# Patient Record
Sex: Female | Born: 1965 | Race: Black or African American | Hispanic: No | Marital: Married | Smoking: Never smoker
Health system: Southern US, Community
[De-identification: ages and names within clinical notes are randomized; demographics above are authoritative.]

## PROBLEM LIST (undated history)

## (undated) ENCOUNTER — Ambulatory Visit: Admission: EM | Payer: 59 | Source: Home / Self Care

## (undated) DIAGNOSIS — F32A Depression, unspecified: Secondary | ICD-10-CM

## (undated) DIAGNOSIS — G43909 Migraine, unspecified, not intractable, without status migrainosus: Secondary | ICD-10-CM

## (undated) DIAGNOSIS — J45909 Unspecified asthma, uncomplicated: Secondary | ICD-10-CM

## (undated) DIAGNOSIS — F329 Major depressive disorder, single episode, unspecified: Secondary | ICD-10-CM

## (undated) HISTORY — PX: TUBAL LIGATION: SHX77

---

## 2005-02-26 ENCOUNTER — Emergency Department: Payer: Self-pay | Admitting: Emergency Medicine

## 2009-11-28 ENCOUNTER — Ambulatory Visit: Payer: Self-pay | Admitting: Family Medicine

## 2012-04-02 ENCOUNTER — Ambulatory Visit: Payer: Self-pay | Admitting: Family Medicine

## 2012-04-17 ENCOUNTER — Ambulatory Visit: Payer: Self-pay

## 2012-04-17 LAB — RAPID STREP-A WITH REFLX: Micro Text Report: NEGATIVE

## 2012-04-21 LAB — BETA STREP CULTURE(ARMC)

## 2014-02-18 ENCOUNTER — Ambulatory Visit: Payer: Self-pay

## 2014-05-25 ENCOUNTER — Ambulatory Visit: Payer: Self-pay | Admitting: Emergency Medicine

## 2014-07-21 ENCOUNTER — Ambulatory Visit
Admit: 2014-07-21 | Disposition: A | Discharge status: Home / Self Care | Payer: Self-pay | Attending: Family Medicine | Admitting: Family Medicine

## 2015-02-28 ENCOUNTER — Ambulatory Visit
Admission: EM | Admit: 2015-02-28 | Discharge: 2015-02-28 | Disposition: A | Discharge status: Home / Self Care | Payer: BLUE CROSS/BLUE SHIELD | Attending: Family Medicine | Admitting: Family Medicine

## 2015-02-28 ENCOUNTER — Ambulatory Visit (INDEPENDENT_AMBULATORY_CARE_PROVIDER_SITE_OTHER): Payer: BLUE CROSS/BLUE SHIELD

## 2015-02-28 ENCOUNTER — Encounter: Payer: Self-pay | Admitting: Emergency Medicine

## 2015-02-28 DIAGNOSIS — J0100 Acute maxillary sinusitis, unspecified: Secondary | ICD-10-CM | POA: Insufficient documentation

## 2015-02-28 DIAGNOSIS — J40 Bronchitis, not specified as acute or chronic: Secondary | ICD-10-CM

## 2015-02-28 DIAGNOSIS — M940 Chondrocostal junction syndrome [Tietze]: Secondary | ICD-10-CM

## 2015-02-28 DIAGNOSIS — R05 Cough: Secondary | ICD-10-CM | POA: Diagnosis present

## 2015-02-28 MED ORDER — PREDNISONE 20 MG PO TABS: 40.0000 mg | ORAL_TABLET | Freq: Every day | ORAL | Status: AC

## 2015-02-28 MED ORDER — AMOXICILLIN-POT CLAVULANATE 875-125 MG PO TABS: 1.0000 | ORAL_TABLET | Freq: Two times a day (BID) | ORAL | Status: DC

## 2015-02-28 MED ORDER — GUAIFENESIN-CODEINE 100-10 MG/5ML PO SOLN: 5.0000 mL | Freq: Three times a day (TID) | ORAL | Status: DC | PRN

## 2015-02-28 NOTE — ED Provider Notes (Signed)
[facility]  ____________________________________________  Time seen: Approximately 9:35 AM  I have reviewed the triage vital signs and the nursing notes.   HISTORY  Chief Complaint Cough and Facial Pain   HPI Grace Martinez is a 49 y.o. female presents for complaints of 5 days of runny nose, congestion, sinus drainage, sinus pressure, and cough. States has history of asthma and reports occasionally has wheezing and reports that the wheezing resolves with home albuterol inhaler. Denies shortness of breath or current wheezing. Patient reports that she has been coughing, sneezing and blowing her nose a lot. Patient present she feels like she has congestion in her chest. Patient states that since Friday when she coughs, sneezes or presses on her chest she has some tenderness to touch. Patient states that she has no chest tenderness or chest pain unless actively touching the area on her chest or moving such as when sneezing. Denies chest pain with deep breaths. Denies current chest pain. Patient reports that chest tenderness is fully reproducible by touch. Again states no chest pain unless actively touching the area or moving.  Reports that she feels she had been getting thick green nasal drainage initially but states now sinus pressure has increased and not getting as much drainage out. States current sinus pressure to her face along her cheek areas is 5 out of 10 aching. States that she does feel some drainage in the back of her throat. States biggest complaint is a sinus discomfort.  Reports continues to eat and drink well. Denies fevers. Denies current chest pain. Denies shortness of breath, abdominal pain, neck or back pain, extremity pain, calf pain, leg swelling, recent trips, recent surgeries or recent immobilization.   History reviewed. No pertinent past medical history.  There are no active problems to display for this patient.   Past Surgical History  Procedure  Laterality Date  . Tubal ligation      Current Outpatient Rx  Name  Route  Sig  Dispense  Refill  . buPROPion (WELLBUTRIN XL) 300 MG 24 hr tablet   Oral   Take 300 mg by mouth daily.         . citalopram (CELEXA) 10 MG tablet   Oral   Take 10 mg by mouth daily.         Marland Kitchen eletriptan (RELPAX) 20 MG tablet   Oral   Take 20 mg by mouth as needed for migraine or headache. May repeat in 2 hours if headache persists or recurs.         . isotretinoin (ACCUTANE) 10 MG capsule   Oral   Take 10 mg by mouth 2 (two) times daily.         .           .           .             Allergies Review of patient's allergies indicates no known allergies.  History reviewed. No pertinent family history.  Social History Social History  Substance Use Topics  . Smoking status: Never Smoker   . Smokeless tobacco: None  . Alcohol Use: Yes    Review of Systems Constitutional: No fever/chills Eyes: No visual changes. ENT: No sore throat. Positive runny nose, nasal congestion, sinus drainage and pressure and intermittent cough and sneezing. Cardiovascular: Denies chest pain. Respiratory: Denies shortness of breath. Gastrointestinal: No abdominal pain.  No nausea, no vomiting.  No diarrhea.  No constipation. Genitourinary: Negative for  dysuria. Musculoskeletal: Negative for back pain. Skin: Negative for rash. Neurological: Negative for headaches, focal weakness or numbness.  10-point ROS otherwise negative.  ____________________________________________   PHYSICAL EXAM:  VITAL SIGNS: ED Triage Vitals  Enc Vitals Group     BP 02/28/15 0908 116/75 mmHg     Pulse Rate 02/28/15 0908 69     Resp 02/28/15 0908 16     Temp 02/28/15 0908 97.2 F (36.2 C)     Temp Source 02/28/15 0908 Tympanic     SpO2 02/28/15 0908 100 %     Weight 02/28/15 0908 196 lb (88.905 kg)     Height 02/28/15 0908  (1.626 m)     Head Cir --      Peak Flow --      Pain Score 02/28/15 0912 3     Pain  Loc --      Pain Edu? --      Excl. in GC? --     Constitutional: Alert and oriented. Well appearing and in no acute distress. Eyes: Conjunctivae are normal. PERRL. EOMI. Head: Atraumatic. mod TTP bilateral maxillary sinuses, mild TTP bilateral frontal sinuses, no erythema, no swelling.   Ears: no erythema, normal TMs bilaterally.   Nose: nasal congestion, greenish nasal drainage. Nasal turbinate erythema.   Mouth/Throat: Mucous membranes are moist.  Oropharynx non-erythematous.No tonsillar swelling or exudate. No uvular shift or deviation.  Neck: No stridor.  No cervical spine tenderness to palpation. Hematological/Lymphatic/Immunilogical: No cervical lymphadenopathy. Cardiovascular: Normal rate, regular rhythm. Grossly normal heart sounds.  Good peripheral circulation. Respiratory: Normal respiratory effort.  No retractions. Mild scattered rhonchi bilateral bases. No wheezes or rales. Good air movement.  mild tenderness  along anterior sternal border, and states that the pain by palpation is same as what she feels at home.  Gastrointestinal: Soft and nontender. No distention. Normal Bowel sounds.  No abdominal bruits. No CVA tenderness.  Musculoskeletal: No lower or upper extremity tenderness nor edema.  No joint effusions. Bilateral pedal pulses equal and easily palpated.  Neurologic:  Normal speech and language. No gross focal neurologic deficits are appreciated. No gait instability. Skin:  Skin is warm, dry and intact. No rash noted. Psychiatric: Mood and affect are normal. Speech and behavior are normal.   PERC negative; Wells score=0.  ____________________________________________   LABS (all labs ordered are listed, but only abnormal results are displayed)  Labs Reviewed - No data to display ____________________________________________  EKG  ED ECG REPORT   Date: 03/03/2015  EKG Time: 9:03 AM  Rate: 66   Rhythm: normal sinus rhythm,  normal EKG, normal sinus rhythm,  unchanged from previous tracings, there are no previous tracings available for comparison  Axis: normal  Intervals:none  ST&T Change: none  Narrative Interpretation: normal sinus rhythm   Reviewed EKG myself and with Dr Judd Gaudier who also agrees with interpretation.   ____________________________________________  RADIOLOGY  EXAM: CHEST 2 VIEW  COMPARISON: None.  FINDINGS: Cardiomediastinal silhouette is unremarkable. No acute infiltrate or pleural effusion. No pulmonary edema. Bony thorax is unremarkable.  IMPRESSION: No active cardiopulmonary disease.   Electronically Signed By: Natasha Mead M.D. On: 02/28/2015 11:06  I, Renford Dills, personally discussed these images and results by phone with the on-call radiologist and used this discussion as part of my medical decision making.   ____________________________________________  INITIAL IMPRESSION / ASSESSMENT AND PLAN / ED COURSE  Pertinent labs & imaging results that were available during my care of the patient were reviewed by me and  considered in my medical decision making (see chart for details).  Very well-appearing patient. No acute distress. Presents for the complaints of 5-6 days of runny nose, nasal congestion, sinus pressure and cough. Patient reports maxillary and frontal sinus tenderness to palpation as well as scattered rhonchi bilateral bases of lungs and dry intermittent cough in room. Patient reports since Friday with a report of some intermittent chest tenderness described as only with palpation or movement, and reports this was gradual onset after coughing. Patient reports the tenderness that she has is fully reproducible by palpation along anterior sternal border, and states that the pain by palpation is same as what she feels at home. Denies other chest pain. Denies resting or exertional chest pain. Denies shortness of breath. Suspect maxillary sinusitis and bronchitis. Do not suspect acute coronary  syndrome or cardiac cause.  Discussed in detail with patient that at any point time that she has chest pain should the need to proceed to the emergency room. Patient verbalized understanding and agreed to this plan. Will treat with oral prednisone, augmentin and guaifenesin/codeine prn.   Discussed follow up with Primary care physician this week. Discussed follow up and return parameters including no resolution or any worsening concerns. Patient verbalized understanding and agreed to plan.   ____________________________________________   FINAL CLINICAL IMPRESSION(S) / ED DIAGNOSES  Final diagnoses:  Bronchitis  Costochondritis  Acute maxillary sinusitis, recurrence not specified       Renford DillsLindsey Tanessa Tidd, NP 03/03/15 1508

## 2015-02-28 NOTE — ED Notes (Signed)
Patient c/o cough and chest congestion, sinus pressure and congestion since last Friday.

## 2015-02-28 NOTE — Discharge Instructions (Signed)
Take medication as prescribed. Rest. Drink plenty of fluids.  Follow-up with your primary care physician in 2-3 days. As discussed return to the urgent care proceed to the emergency room for chest pain, shortness of breath, weakness, dizziness, fever, new or worsening concerns.   Costochondritis Costochondritis is a condition in which the tissue (cartilage) that connects your ribs with your breastbone (sternum) becomes irritated. It causes pain in the chest and rib area. It usually goes away on its own over time. HOME CARE  Avoid activities that wear you out.  Do not strain your ribs. Avoid activities that use your:  Chest.  Belly.  Side muscles.  Put ice on the area for the first 2 days after the pain starts.  Put ice in a plastic bag.  Place a towel between your skin and the bag.  Leave the ice on for 20 minutes, 2-3 times a day.  Only take medicine as told by your doctor. GET HELP IF:  You have redness or puffiness (swelling) in the rib area.  Your pain does not go away with rest or medicine. GET HELP RIGHT AWAY IF:   Your pain gets worse.  You are very uncomfortable.  You have trouble breathing.  You cough up blood.  You start sweating or throwing up (vomiting).  You have a fever or lasting symptoms for more than 2-3 days.  You have a fever and your symptoms suddenly get worse. MAKE SURE YOU:   Understand these instructions.  Will watch your condition.  Will get help right away if you are not doing well or get worse.   This information is not intended to replace advice given to you by your health care provider. Make sure you discuss any questions you have with your health care provider.   Document Released: 09/19/2007 Document Revised: 12/03/2012 Document Reviewed: 11/04/2012 Elsevier Interactive Patient Education 2016 [employer]  Sinusitis, Adult Sinusitis is redness, soreness, and inflammation of the paranasal sinuses. Paranasal sinuses are air  pockets within the bones of your face. They are located beneath your eyes, in the middle of your forehead, and above your eyes. In healthy paranasal sinuses, mucus is able to drain out, and air is able to circulate through them by way of your nose. However, when your paranasal sinuses are inflamed, mucus and air can become trapped. This can allow bacteria and other germs to grow and cause infection. Sinusitis can develop quickly and last only a short time (acute) or continue over a long period (chronic). Sinusitis that lasts for more than 12 weeks is considered chronic. CAUSES Causes of sinusitis include:  Allergies.  Structural abnormalities, such as displacement of the cartilage that separates your nostrils (deviated septum), which can decrease the air flow through your nose and sinuses and affect sinus drainage.  Functional abnormalities, such as when the small hairs (cilia) that line your sinuses and help remove mucus do not work properly or are not present. SIGNS AND SYMPTOMS Symptoms of acute and chronic sinusitis are the same. The primary symptoms are pain and pressure around the affected sinuses. Other symptoms include:  Upper toothache.  Earache.  Headache.  Bad breath.  Decreased sense of smell and taste.  A cough, which worsens when you are lying flat.  Fatigue.  Fever.  Thick drainage from your nose, which often is green and may contain pus (purulent).  Swelling and warmth over the affected sinuses. DIAGNOSIS Your health care provider will perform a physical exam. During your exam, your health  care provider may perform any of the following to help determine if you have acute sinusitis or chronic sinusitis:  Look in your nose for signs of abnormal growths in your nostrils (nasal polyps).  Tap over the affected sinus to check for signs of infection.  View the inside of your sinuses using an imaging device that has a light attached (endoscope). If your health care  provider suspects that you have chronic sinusitis, one or more of the following tests may be recommended:  Allergy tests.  Nasal culture. A sample of mucus is taken from your nose, sent to a lab, and screened for bacteria.  Nasal cytology. A sample of mucus is taken from your nose and examined by your health care provider to determine if your sinusitis is related to an allergy. TREATMENT Most cases of acute sinusitis are related to a viral infection and will resolve on their own within 10 days. Sometimes, medicines are prescribed to help relieve symptoms of both acute and chronic sinusitis. These may include pain medicines, decongestants, nasal steroid sprays, or saline sprays. However, for sinusitis related to a bacterial infection, your health care provider will prescribe antibiotic medicines. These are medicines that will help kill the bacteria causing the infection. Rarely, sinusitis is caused by a fungal infection. In these cases, your health care provider will prescribe antifungal medicine. For some cases of chronic sinusitis, surgery is needed. Generally, these are cases in which sinusitis recurs more than 3 times per year, despite other treatments. HOME CARE INSTRUCTIONS  Drink plenty of water. Water helps thin the mucus so your sinuses can drain more easily.  Use a humidifier.  Inhale steam 3-4 times a day (for example, sit in the bathroom with the shower running).  Apply a warm, moist washcloth to your face 3-4 times a day, or as directed by your health care provider.  Use saline nasal sprays to help moisten and clean your sinuses.  Take medicines only as directed by your health care provider.  If you were prescribed either an antibiotic or antifungal medicine, finish it all even if you start to feel better. SEEK IMMEDIATE MEDICAL CARE IF:  You have increasing pain or severe headaches.  You have nausea, vomiting, or drowsiness.  You have swelling around your face.  You  have vision problems.  You have a stiff neck.  You have difficulty breathing.   This information is not intended to replace advice given to you by your health care provider. Make sure you discuss any questions you have with your health care provider.   Document Released: 04/02/2005 Document Revised: 04/23/2014 Document Reviewed: 04/17/2011 Elsevier Interactive Patient Education [employer]

## 2015-05-25 ENCOUNTER — Encounter: Payer: Self-pay | Admitting: *Deleted

## 2015-05-25 ENCOUNTER — Ambulatory Visit
Admission: EM | Admit: 2015-05-25 | Discharge: 2015-05-25 | Disposition: A | Discharge status: Home / Self Care | Payer: 59 | Attending: Family Medicine | Admitting: Family Medicine

## 2015-05-25 DIAGNOSIS — J101 Influenza due to other identified influenza virus with other respiratory manifestations: Secondary | ICD-10-CM | POA: Diagnosis not present

## 2015-05-25 HISTORY — DX: Depression, unspecified: F32.A

## 2015-05-25 HISTORY — DX: Migraine, unspecified, not intractable, without status migrainosus: G43.909

## 2015-05-25 HISTORY — DX: Unspecified asthma, uncomplicated: J45.909

## 2015-05-25 HISTORY — DX: Major depressive disorder, single episode, unspecified: F32.9

## 2015-05-25 LAB — RAPID INFLUENZA A&B ANTIGENS (ARMC ONLY)
INFLUENZA A (ARMC): DETECTED
INFLUENZA B (ARMC): NOT DETECTED

## 2015-05-25 LAB — RAPID STREP SCREEN (MED CTR MEBANE ONLY): Streptococcus, Group A Screen (Direct): NEGATIVE

## 2015-05-25 MED ORDER — OSELTAMIVIR PHOSPHATE 75 MG PO CAPS: 75.0000 mg | ORAL_CAPSULE | Freq: Two times a day (BID) | ORAL | Status: DC

## 2015-05-25 NOTE — ED Notes (Signed)
Patient started vomiting this past Monday followed by a fever. Tuesday severe sore throat and cough symptoms developed. Patient's condition has steadily worsened.

## 2015-05-25 NOTE — ED Provider Notes (Signed)
CSN: [id number]     Arrival date & time 05/25/15  1115 History   First MD Initiated Contact with Patient 05/25/15 1224     Chief Complaint  Patient presents with  . Sore Throat  . Emesis   (Consider location/radiation/quality/duration/timing/severity/associated sxs/prior Treatment) HPI patient presents today with symptoms of fever that started on Monday. Patient states she had one episode of vomiting that day. Since that time she has had sore throat and cough symptoms. She denies any chest pain or shortness of breath. She does have asthma and does take her albuterol inhaler as needed. She denies any diarrhea, abdominal pain, severe headache.  Past Medical History  Diagnosis Date  . Asthma   . Migraines   . Depression    Past Surgical History  Procedure Laterality Date  . Tubal ligation     No family history on file. Social History  Substance Use Topics  . Smoking status: Never Smoker   . Smokeless tobacco: Never Used  . Alcohol Use: Yes   OB History    No data available     Review of Systems: Negative except mentioned above.   Allergies  Review of patient's allergies indicates no known allergies.  Home Medications   Prior to Admission medications   Medication Sig Start Date End Date Taking? Authorizing Provider  buPROPion (WELLBUTRIN XL) 300 MG 24 hr tablet Take 300 mg by mouth daily.   Yes Historical Provider, MD  citalopram (CELEXA) 10 MG tablet Take 10 mg by mouth daily.   Yes Historical Provider, MD  eletriptan (RELPAX) 20 MG tablet Take 20 mg by mouth as needed for migraine or headache. May repeat in 2 hours if headache persists or recurs.   Yes Historical Provider, MD  guaiFENesin-codeine 100-10 MG/5ML syrup Take 5 mLs by mouth 3 (three) times daily as needed for cough. 02/28/15  Yes Renford Dills, NP  ibuprofen (ADVIL,MOTRIN) 200 MG tablet Take 200 mg by mouth every 6 (six) hours as needed.   Yes Historical Provider, MD  amoxicillin-clavulanate (AUGMENTIN) 875-125  MG tablet Take 1 tablet by mouth every 12 (twelve) hours. 02/28/15   Renford Dills, NP  isotretinoin (ACCUTANE) 10 MG capsule Take 10 mg by mouth 2 (two) times daily.    Historical Provider, MD  oseltamivir (TAMIFLU) 75 MG capsule Take 1 capsule (75 mg total) by mouth every 12 (twelve) hours. 05/25/15   Jolene Provost, MD   Meds Ordered and Administered this Visit  Medications - No data to display  BP 123/85 mmHg  Pulse 101  Temp(Src) 100.9 F (38.3 C) (Oral)  Resp 20  Ht  (1.626 m)  Wt 192 lb (87.091 kg)  BMI 32.94 kg/m2  SpO2 96%  LMP 05/25/2015 No data found.   Physical Exam  GENERAL: NAD HEENT: mild pharyngeal erythema, no exudate, no erythema of TMs, no cervical LAD RESP: CTA B, no retractions or accessory muscle use  CARD: RRR ABD: NT/ND, no rebound or guarding  NEURO: CN II-XII grossly intact    ED Course  Procedures (including critical care time)  Labs Review Labs Reviewed  RAPID INFLUENZA A&B ANTIGENS ([facility] ONLY)  RAPID STREP SCREEN (NOT AT [facility])  CULTURE, GROUP A STREP [facility])    Imaging Review No results found.     MDM   1. Influenza A    Will treat with Tamiflu, Tylenol/Motrin when necessary, rest, hydration, Delsym when necessary, Claritin when necessary, albuterol when necessary, seek medical attention if symptoms persist or worsen as discussed. Work  excuse given for 2 days.    Jolene Provost, MD 05/25/15 [phone]

## 2015-05-27 LAB — CULTURE, GROUP A STREP (THRC)

## 2015-12-15 ENCOUNTER — Other Ambulatory Visit: Payer: Self-pay | Admitting: Family Medicine

## 2015-12-15 DIAGNOSIS — Z1231 Encounter for screening mammogram for malignant neoplasm of breast: Secondary | ICD-10-CM

## 2016-02-23 ENCOUNTER — Ambulatory Visit
Admission: RE | Admit: 2016-02-23 | Discharge: 2016-02-23 | Disposition: A | Discharge status: Home / Self Care | Payer: 59 | Source: Ambulatory Visit | Attending: Family Medicine | Admitting: Family Medicine

## 2016-02-23 ENCOUNTER — Encounter (INDEPENDENT_AMBULATORY_CARE_PROVIDER_SITE_OTHER): Payer: Self-pay

## 2016-02-23 DIAGNOSIS — Z1231 Encounter for screening mammogram for malignant neoplasm of breast: Secondary | ICD-10-CM | POA: Diagnosis not present

## 2016-04-23 ENCOUNTER — Ambulatory Visit
Admission: EM | Admit: 2016-04-23 | Discharge: 2016-04-23 | Disposition: A | Discharge status: Home / Self Care | Payer: 59 | Attending: Family Medicine | Admitting: Family Medicine

## 2016-04-23 ENCOUNTER — Encounter: Payer: Self-pay | Admitting: *Deleted

## 2016-04-23 DIAGNOSIS — J0100 Acute maxillary sinusitis, unspecified: Secondary | ICD-10-CM

## 2016-04-23 DIAGNOSIS — J029 Acute pharyngitis, unspecified: Secondary | ICD-10-CM | POA: Diagnosis not present

## 2016-04-23 LAB — RAPID STREP SCREEN (MED CTR MEBANE ONLY): STREPTOCOCCUS, GROUP A SCREEN (DIRECT): NEGATIVE

## 2016-04-23 MED ORDER — FEXOFENADINE-PSEUDOEPHED ER 60-120 MG PO TB12: 1.0000 | ORAL_TABLET | Freq: Two times a day (BID) | ORAL | 0 refills | Status: DC

## 2016-04-23 MED ORDER — BENZONATATE 200 MG PO CAPS: 200.0000 mg | ORAL_CAPSULE | Freq: Three times a day (TID) | ORAL | 0 refills | Status: DC | PRN

## 2016-04-23 MED ORDER — CEFUROXIME AXETIL 500 MG PO TABS: 500.0000 mg | ORAL_TABLET | Freq: Two times a day (BID) | ORAL | 0 refills | Status: DC

## 2016-04-23 MED ORDER — FLUTICASONE PROPIONATE 50 MCG/ACT NA SUSP
2.0000 | Freq: Every day | NASAL | 0 refills | Status: DC
Start: 2016-04-23 — End: 2019-05-07

## 2016-04-23 NOTE — ED Triage Notes (Signed)
Patient started having symptoms of nasal congestion / drainage, sinus pressure, followed by sore throat for 2 weeks. Patient has a history of sinus infections.

## 2016-04-23 NOTE — ED Provider Notes (Signed)
[facility]    CSN: [id number] Arrival date & time: 04/23/16  1437     History   Chief Complaint Chief Complaint  Patient presents with  . Nasal Congestion  . Sore Throat    HPI Grace Martinez is a 51 y.o. female.   Patient's here because of nasal congestion cough and pressure this been going on at least for the last 10-12 days. She reports it started after Christmas and she's had pressure and nasal congestion since then. She has a history of asthma daily basis so she has had some wheezing but nothing different. She reports some coughing at night and difficulty sleeping as well. Willing yellow screen material from her nostrils and feeling of sinus pressure and heaviness over her sinuses. She has history of asthma depression and migraines she's had tubal ligation. She never smoked no known drug allergies and has a history of breast cancer family.   The history is provided by the patient. No language interpreter was used.  Sore Throat  This is a new problem. The current episode started more than 1 week ago. The problem has been gradually worsening. Pertinent negatives include no chest pain, no abdominal pain, no headaches and no shortness of breath. Nothing aggravates the symptoms. Nothing relieves the symptoms. She has tried nothing for the symptoms. The treatment provided no relief.    Past Medical History:  Diagnosis Date  . Asthma   . Depression   . Migraines     There are no active problems to display for this patient.   Past Surgical History:  Procedure Laterality Date  . TUBAL LIGATION      OB History    No data available       Home Medications    Prior to Admission medications   Medication Sig Start Date End Date Taking? Authorizing Provider  buPROPion (WELLBUTRIN XL) 300 MG 24 hr tablet Take 300 mg by mouth daily.   Yes Historical Provider, MD  citalopram (CELEXA) 10 MG tablet Take 10 mg by mouth daily.   Yes Historical Provider, MD    eletriptan (RELPAX) 20 MG tablet Take 20 mg by mouth as needed for migraine or headache. May repeat in 2 hours if headache persists or recurs.   Yes Historical Provider, MD  ibuprofen (ADVIL,MOTRIN) 200 MG tablet Take 200 mg by mouth every 6 (six) hours as needed.   Yes Historical Provider, MD  amoxicillin-clavulanate (AUGMENTIN) 875-125 MG tablet Take 1 tablet by mouth every 12 (twelve) hours. 02/28/15   Renford Dills, NP  benzonatate (TESSALON) 200 MG capsule Take 1 capsule (200 mg total) by mouth 3 (three) times daily as needed. 04/23/16   Hassan Rowan, MD  cefUROXime (CEFTIN) 500 MG tablet Take 1 tablet (500 mg total) by mouth 2 (two) times daily. 04/23/16   Hassan Rowan, MD  fexofenadine-pseudoephedrine (ALLEGRA-D) 60-120 MG 12 hr tablet Take 1 tablet by mouth every 12 (twelve) hours. 04/23/16   Hassan Rowan, MD  fluticasone (FLONASE) 50 MCG/ACT nasal spray Place 2 sprays into both nostrils daily. 04/23/16   Hassan Rowan, MD  guaiFENesin-codeine 100-10 MG/5ML syrup Take 5 mLs by mouth 3 (three) times daily as needed for cough. 02/28/15   Renford Dills, NP  isotretinoin (ACCUTANE) 10 MG capsule Take 10 mg by mouth 2 (two) times daily.    Historical Provider, MD  oseltamivir (TAMIFLU) 75 MG capsule Take 1 capsule (75 mg total) by mouth every 12 (twelve) hours. 05/25/15   Jolene Provost, MD  Family History Family History  Problem Relation Age of Onset  . Breast cancer Neg Hx     Social History Social History  Substance Use Topics  . Smoking status: Never Smoker  . Smokeless tobacco: Never Used  . Alcohol use Yes     Allergies   Patient has no known allergies.   Review of Systems Review of Systems  HENT: Positive for sinus pain, sinus pressure, sore throat and trouble swallowing.   Respiratory: Positive for wheezing. Negative for shortness of breath.   Cardiovascular: Negative for chest pain.  Gastrointestinal: Negative for abdominal pain.  Neurological: Negative for headaches.  All  other systems reviewed and are negative.    Physical Exam Triage Vital Signs ED Triage Vitals  Enc Vitals Group     BP 04/23/16 1458 112/75     Pulse Rate 04/23/16 1458 75     Resp 04/23/16 1458 16     Temp 04/23/16 1458 99.2 F (37.3 C)     Temp Source 04/23/16 1458 Oral     SpO2 04/23/16 1458 100 %     Weight 04/23/16 1459 190 lb (86.2 kg)     Height 04/23/16 1459 5\' 2"  (1.575 m)     Head Circumference --      Peak Flow --      Pain Score 04/23/16 1501 0     Pain Loc --      Pain Edu? --      Excl. in GC? --    No data found.   Updated Vital Signs BP 112/75 (BP Location: Right Arm)   Pulse 75   Temp 99.2 F (37.3 C) (Oral)   Resp 16   Ht 5\' 2"  (1.575 m)   Wt 190 lb (86.2 kg)   LMP 04/17/2016   SpO2 100%   BMI 34.75 kg/m   Visual Acuity Right Eye Distance:   Left Eye Distance:   Bilateral Distance:    Right Eye Near:   Left Eye Near:    Bilateral Near:     Physical Exam  Constitutional: She is oriented to person, place, and time. She appears well-developed and well-nourished. No distress.  HENT:  Head: Normocephalic and atraumatic.  Right Ear: Hearing, tympanic membrane and ear canal normal.  Left Ear: Hearing, tympanic membrane, external ear and ear canal normal.  Nose: Mucosal edema present. No rhinorrhea. Right sinus exhibits maxillary sinus tenderness and frontal sinus tenderness. Left sinus exhibits maxillary sinus tenderness and frontal sinus tenderness.  Mouth/Throat: Uvula is midline and mucous membranes are normal. Normal dentition. Posterior oropharyngeal edema and posterior oropharyngeal erythema present.  Eyes: Pupils are equal, round, and reactive to light.  Neck: Normal range of motion. Neck supple.  Cardiovascular: Normal rate and regular rhythm.   Pulmonary/Chest: Effort normal and breath sounds normal.  Musculoskeletal: Normal range of motion.  Lymphadenopathy:    She has cervical adenopathy.  Neurological: She is alert and oriented to  person, place, and time.  Skin: She is not diaphoretic.  Psychiatric: She has a normal mood and affect.  Vitals reviewed.    UC Treatments / Results  Labs (all labs ordered are listed, but only abnormal results are displayed) Labs Reviewed  RAPID STREP SCREEN (NOT AT [facility])  CULTURE, GROUP A STREP [facility])    EKG  EKG Interpretation None       Radiology No results found.  Procedures Procedures (including critical care time)  Medications Ordered in UC Medications - No data to display  Initial Impression / Assessment and Plan / UC Course  I have reviewed the triage vital signs and the nursing notes.  Pertinent labs & imaging results that were available during my care of the patient were reviewed by me and considered in my medical decision making (see chart for details). Results for orders placed or performed during the hospital encounter of 04/23/16  Rapid strep screen  Result Value Ref Range   Streptococcus, Group A Screen (Direct) NEGATIVE NEGATIVE   Clinical Course     Patient strep test was negative will treat with a before a cellulitis with Ceftin 500 one tablet twice day 10 days test on procedure cough Allegra-D for the congestion and Flonase nasal spray to use twice a day as needed. Follow-up PCP if not better.  Final Clinical Impressions(s) / UC Diagnoses   Final diagnoses:  Acute maxillary sinusitis, recurrence not specified  Acute pharyngitis, unspecified etiology    New Prescriptions New Prescriptions   BENZONATATE (TESSALON) 200 MG CAPSULE    Take 1 capsule (200 mg total) by mouth 3 (three) times daily as needed.   CEFUROXIME (CEFTIN) 500 MG TABLET    Take 1 tablet (500 mg total) by mouth 2 (two) times daily.   FEXOFENADINE-PSEUDOEPHEDRINE (ALLEGRA-D) 60-120 MG 12 HR TABLET    Take 1 tablet by mouth every 12 (twelve) hours.   FLUTICASONE (FLONASE) 50 MCG/ACT NASAL SPRAY    Place 2 sprays into both nostrils daily.    Note: This dictation was prepared  with Dragon dictation along with smaller phrase technology. Any transcriptional errors that result from this process are unintentional.   Hassan RowanEugene Narely Nobles, MD 04/23/16 [phone]

## 2016-04-26 LAB — CULTURE, GROUP A STREP (THRC)

## 2016-12-21 ENCOUNTER — Other Ambulatory Visit: Payer: Self-pay | Admitting: Family Medicine

## 2016-12-21 DIAGNOSIS — Z1239 Encounter for other screening for malignant neoplasm of breast: Secondary | ICD-10-CM

## 2016-12-21 DIAGNOSIS — Z1231 Encounter for screening mammogram for malignant neoplasm of breast: Secondary | ICD-10-CM

## 2018-01-07 ENCOUNTER — Other Ambulatory Visit: Payer: Self-pay

## 2018-01-07 ENCOUNTER — Ambulatory Visit
Admission: EM | Admit: 2018-01-07 | Discharge: 2018-01-07 | Disposition: A | Discharge status: Home / Self Care | Payer: 59 | Attending: Family Medicine | Admitting: Family Medicine

## 2018-01-07 DIAGNOSIS — G43009 Migraine without aura, not intractable, without status migrainosus: Secondary | ICD-10-CM

## 2018-01-07 MED ORDER — HYDROCODONE-ACETAMINOPHEN 5-325 MG PO TABS: ORAL_TABLET | ORAL | 0 refills | Status: DC

## 2018-01-07 MED ORDER — KETOROLAC TROMETHAMINE 60 MG/2ML IM SOLN
60.0000 mg | Freq: Once | INTRAMUSCULAR | Status: AC
  Administered 2018-01-07: 60 mg via INTRAMUSCULAR

## 2018-01-07 MED ORDER — CYCLOBENZAPRINE HCL 5 MG PO TABS: 5.0000 mg | ORAL_TABLET | Freq: Every day | ORAL | 0 refills | Status: DC

## 2018-01-07 NOTE — ED Triage Notes (Signed)
Patient complains of headaches, migraines, back pain and right hand numbness x 2 weeks. States that she was seen by hand surgeon and was placed in a brace, no relief. Patient states that for her migraine she has been taking advil migraine medication.

## 2018-01-13 ENCOUNTER — Other Ambulatory Visit: Payer: Self-pay | Admitting: Family Medicine

## 2018-01-13 DIAGNOSIS — Z1231 Encounter for screening mammogram for malignant neoplasm of breast: Secondary | ICD-10-CM

## 2018-04-04 ENCOUNTER — Other Ambulatory Visit: Payer: Self-pay

## 2018-04-04 ENCOUNTER — Ambulatory Visit
Admission: EM | Admit: 2018-04-04 | Discharge: 2018-04-04 | Disposition: A | Discharge status: Home / Self Care | Payer: 59 | Attending: Family Medicine | Admitting: Family Medicine

## 2018-04-04 DIAGNOSIS — J4521 Mild intermittent asthma with (acute) exacerbation: Secondary | ICD-10-CM | POA: Insufficient documentation

## 2018-04-04 MED ORDER — PREDNISONE 20 MG PO TABS: ORAL_TABLET | ORAL | 0 refills | Status: DC

## 2018-04-04 MED ORDER — AEROCHAMBER PLUS MISC: 2 refills | Status: DC

## 2018-04-04 MED ORDER — IPRATROPIUM-ALBUTEROL 0.5-2.5 (3) MG/3ML IN SOLN
3.0000 mL | Freq: Once | RESPIRATORY_TRACT | Status: AC
  Administered 2018-04-04: 3 mL via RESPIRATORY_TRACT

## 2018-04-04 NOTE — ED Provider Notes (Signed)
[facility]    CSN: [id number] Arrival date & time: 04/04/18  1335     History   Chief Complaint Chief Complaint  Patient presents with  . Shortness of Breath    HPI Grace Martinez is a 52 y.o. female.   HPI  -year-old female with history of asthma presents with asthma that she had at work this morning.  She states that she awoke not feeling well and then got to work and had noticed that she was having difficulty getting air in and out.  He states that she then had anxiety which would seem to worsen things.  Has not inhaler at home albuterol that she states does not seem to be working.  Also takes a singular tablet at nighttime.  O2 sats on room air 97%.  Afebrile.  She has not noticed any significant wheezing.  Has had no fever or chills.       Past Medical History:  Diagnosis Date  . Asthma   . Depression   . Migraines     There are no active problems to display for this patient.   Past Surgical History:  Procedure Laterality Date  . TUBAL LIGATION      OB History   No obstetric history on file.      Home Medications    Prior to Admission medications   Medication Sig Start Date End Date Taking? Authorizing Provider  buPROPion (WELLBUTRIN XL) 300 MG 24 hr tablet Take 300 mg by mouth daily.    [provider]  cholecalciferol (VITAMIN D) 1000 units tablet Take 1,000 Units by mouth daily.    [provider]  citalopram (CELEXA) 10 MG tablet Take 10 mg by mouth daily.    [provider]  cyclobenzaprine (FLEXERIL) 5 MG tablet Take 1 tablet (5 mg total) by mouth at bedtime. 01/07/18   Payton Mccallumonty, [place], MD  eletriptan (RELPAX) 20 MG tablet Take 20 mg by mouth as needed for migraine or headache. May repeat in 2 hours if headache persists or recurs.    [provider]  fexofenadine-pseudoephedrine (ALLEGRA-D) 60-120 MG 12 hr tablet Take 1 tablet by mouth every 12 (twelve) hours. 04/23/16   Hassan RowanWade, Eugene, MD  fluticasone  (FLONASE) 50 MCG/ACT nasal spray Place 2 sprays into both nostrils daily. 04/23/16   Hassan RowanWade, Eugene, MD  ibuprofen (ADVIL,MOTRIN) 200 MG tablet Take 200 mg by mouth every 6 (six) hours as needed.    [provider]  montelukast (SINGULAIR) 10 MG tablet Take by mouth. 08/23/17   [provider]  predniSONE (DELTASONE) 20 MG tablet Take 2 tablets (40 mg) daily by mouth 04/04/18   Lutricia Feiloemer, Jameeka Marcy P, PA-C  Spacer/Aero-Holding Chambers (AEROCHAMBER PLUS) inhaler Use as instructed 04/04/18   Lutricia Feiloemer, Darenda Fike P, PA-C  vitamin B-12 (CYANOCOBALAMIN) 1000 MCG tablet Take 1,000 mcg by mouth daily.    [provider]    Family History Family History  Problem Relation Age of Onset  . Breast cancer Neg Hx     Social History Social History   Tobacco Use  . Smoking status: Never Smoker  . Smokeless tobacco: Never Used  Substance Use Topics  . Alcohol use: Yes    Comment: occasional  . Drug use: No     Allergies   Patient has no known allergies.   Review of Systems Review of Systems  Constitutional: Positive for activity change. Negative for appetite change, chills, fatigue and fever.  Respiratory: Positive for cough and shortness of  breath.   All other systems reviewed and are negative.    Physical Exam Triage Vital Signs ED Triage Vitals  Enc Vitals Group     BP 04/04/18 1350 139/83     Pulse Rate 04/04/18 1350 70     Resp 04/04/18 1350 (!) 24     Temp 04/04/18 1350 98.1 F (36.7 C)     Temp Source 04/04/18 1350 Oral     SpO2 04/04/18 1350 97 %     Weight 04/04/18 1351 198 lb (89.8 kg)     Height 04/04/18 1351 5\' 4"  (1.626 m)     Head Circumference --      Peak Flow --      Pain Score 04/04/18 1351 0     Pain Loc --      Pain Edu? --      Excl. in GC? --    No data found.  Updated Vital Signs BP 139/83 (BP Location: Right Arm)   Pulse 70   Temp 98.1 F (36.7 C) (Oral)   Resp (!) 24   Ht 5\' 4"  (1.626 m)   Wt 198 lb (89.8 kg)   LMP 03/21/2018    SpO2 97%   BMI 33.99 kg/m   Visual Acuity Right Eye Distance:   Left Eye Distance:   Bilateral Distance:    Right Eye Near:   Left Eye Near:    Bilateral Near:     Physical Exam Vitals signs and nursing note reviewed.  Constitutional:      General: She is not in acute distress.    Appearance: She is well-developed. She is not ill-appearing, toxic-appearing or diaphoretic.  HENT:     Head: Normocephalic and atraumatic.     Mouth/Throat:     Mouth: Mucous membranes are moist.     Pharynx: No pharyngeal swelling or oropharyngeal exudate.  Eyes:     Pupils: Pupils are equal, round, and reactive to light.  Neck:     Musculoskeletal: Normal range of motion.  Pulmonary:     Effort: Pulmonary effort is normal. No respiratory distress.     Breath sounds: Normal breath sounds.  Musculoskeletal: Normal range of motion.  Lymphadenopathy:     Cervical: No cervical adenopathy.  Skin:    General: Skin is warm and dry.  Neurological:     General: No focal deficit present.     Mental Status: She is alert.  Psychiatric:        Mood and Affect: Mood normal.        Behavior: Behavior normal.      UC Treatments / Results  Labs (all labs ordered are listed, but only abnormal results are displayed) Labs Reviewed - No data to display  EKG None  Radiology No results found.  Procedures Procedures (including critical care time)  Medications Ordered in UC Medications  ipratropium-albuterol (DUONEB) 0.5-2.5 (3) MG/3ML nebulizer solution 3 mL (3 mLs Nebulization Given 04/04/18 1412)   Had improvement in her breathing after the DuoNeb treatment. Initial Impression / Assessment and Plan / UC Course  I have reviewed the triage vital signs and the nursing notes.  Pertinent labs & imaging results that were available during my care of the patient were reviewed by me and considered in my medical decision making (see chart for details).   I will place the patient on prednisone for 4  days.  Given her a spacer for her albuterol; she she states that she does not feel like she gets  her full medication with each spray.  I recommended that she follow-up with her primary care physician because of her mild intermittent asthma.   Final Clinical Impressions(s) / UC Diagnoses   Final diagnoses:  Mild intermittent asthma with exacerbation   Discharge Instructions   None    ED Prescriptions    Medication Sig Dispense Auth. Provider   predniSONE (DELTASONE) 20 MG tablet Take 2 tablets (40 mg) daily by mouth 8 tablet Lutricia Feil, PA-C   Spacer/Aero-Holding Chambers (AEROCHAMBER PLUS) inhaler Use as instructed 1 each Lutricia Feil, PA-C     Controlled Substance Prescriptions [place] Controlled Substance Registry consulted? Not Applicable   Lutricia Feil, PA-C 04/04/18 1610

## 2018-04-04 NOTE — ED Triage Notes (Signed)
"  I have asthma and had an attack at work today." I need a breathing treatment. Inhaler not doing enough good

## 2019-05-07 ENCOUNTER — Ambulatory Visit (INDEPENDENT_AMBULATORY_CARE_PROVIDER_SITE_OTHER): Payer: 59

## 2019-05-07 ENCOUNTER — Other Ambulatory Visit: Payer: Self-pay

## 2019-05-07 ENCOUNTER — Ambulatory Visit
Admission: EM | Admit: 2019-05-07 | Discharge: 2019-05-07 | Disposition: A | Discharge status: Home / Self Care | Payer: 59

## 2019-05-07 DIAGNOSIS — M25512 Pain in left shoulder: Secondary | ICD-10-CM | POA: Diagnosis not present

## 2019-05-07 DIAGNOSIS — M7532 Calcific tendinitis of left shoulder: Secondary | ICD-10-CM

## 2019-05-07 DIAGNOSIS — M5412 Radiculopathy, cervical region: Secondary | ICD-10-CM | POA: Diagnosis not present

## 2019-05-07 DIAGNOSIS — M792 Neuralgia and neuritis, unspecified: Secondary | ICD-10-CM

## 2019-05-07 MED ORDER — METHYLPREDNISOLONE 4 MG PO TBPK: ORAL_TABLET | ORAL | 0 refills | Status: DC

## 2019-05-07 MED ORDER — KETOROLAC TROMETHAMINE 30 MG/ML IJ SOLN
30.0000 mg | Freq: Once | INTRAMUSCULAR | Status: AC
Start: 2019-05-07 — End: 2019-05-07
  Administered 2019-05-07: 30 mg via INTRAMUSCULAR

## 2019-05-07 MED ORDER — HYDROCODONE-ACETAMINOPHEN 5-325 MG PO TABS: 1.0000 | ORAL_TABLET | Freq: Two times a day (BID) | ORAL | 0 refills | Status: DC | PRN

## 2019-05-07 NOTE — Discharge Instructions (Addendum)
It was very nice seeing you today in clinic. Thank you for entrusting me with your care.   Rest and apply moist heat and/or ice for 15-20 minutes at a time 3-4 times a day. Please utilize the medications that we discussed. Your prescriptions has been called in to your pharmacy. Be careful with the Norco, as it can make you sleepy. No ibuprofen while on the steroids.   Make arrangements to follow up with orthopedic doctor in 1 week for re-evaluation if not improving. I have provided you the name and office contact information for an excellent local provider. If your symptoms/condition worsens, please seek follow up care either here or in the ER. Please remember, our [facility] providers are "right here with you" when you need [place].   Again, it was my pleasure to take care of you today. Thank you for choosing our clinic. I hope that you start to feel better quickly.   Quentin Mulling, MSN, APRN, FNP-C, CEN Advanced Practice Provider [facility]

## 2019-05-07 NOTE — ED Provider Notes (Signed)
[place], [place]   Name: Brendan Gruwell DOB: [birth year] MRN: [id number] CSN: [id number] PCP: [facility]  Arrival date and time:  05/07/19 1147  Chief Complaint:  Shoulder Pain   NOTE: Prior to seeing the patient today, I have reviewed the triage nursing documentation and vital signs. Clinical staff has updated patient's PMH/PSHx, current medication list, and drug allergies/intolerances to ensure comprehensive history available to assist in medical decision making.   History:   HPI: Grace Martinez is a 54 y.o. female who presents today with complaints of a 2 week history of worsening pain in her LEFT shoulder. Patient denies acute injury. Patient states, "I think it is dislocated with all of the pain I am having".  Patient reports a remote shoulder injury (2018) that was sustained when she slipped on ice and fell.  Patient was never seen for evaluation.  Patient has never had any type of surgical intervention on her shoulder.  She complains of radicular symptoms extending from the shoulder to her hand. Pain is exacerbated by abduction of the extremity. In efforts to conservatively manage her pain at home, patient has been using APAP, Aleve, and IBU.  Patient advising that none of the interventions that she has tried at home have helped to improve her pain.  Patient notes that her pain is keeping her up at night.  Patient feels like her shoulder is swollen. Pain reported to be 10/10 in clinic today.   Past Medical History:  Diagnosis Date  . Asthma   . Depression   . Migraines     Past Surgical History:  Procedure Laterality Date  . TUBAL LIGATION      Family History  Problem Relation Age of Onset  . Breast cancer Neg Hx     Social History   Tobacco Use  . Smoking status: Never Smoker  . Smokeless tobacco: Never Used  Substance Use Topics  . Alcohol use: Yes    Comment: occasional  . Drug use: No    There are no problems to display for this  patient.   Home Medications:    Current Meds  Medication Sig  . albuterol (VENTOLIN HFA) 108 (90 Base) MCG/ACT inhaler Inhale 2 puffs into the lungs as needed.  Marland Kitchen buPROPion (WELLBUTRIN XL) 300 MG 24 hr tablet Take 300 mg by mouth daily.  . cholecalciferol (VITAMIN D) 1000 units tablet Take 1,000 Units by mouth daily.  . citalopram (CELEXA) 10 MG tablet Take 10 mg by mouth daily.  . cyclobenzaprine (FLEXERIL) 5 MG tablet Take 1 tablet (5 mg total) by mouth at bedtime.  Marland Kitchen eletriptan (RELPAX) 20 MG tablet Take 20 mg by mouth as needed for migraine or headache. May repeat in 2 hours if headache persists or recurs.  Marland Kitchen ibuprofen (ADVIL,MOTRIN) 200 MG tablet Take 200 mg by mouth every 6 (six) hours as needed.  . montelukast (SINGULAIR) 10 MG tablet Take by mouth.  Marland Kitchen QVAR REDIHALER 80 MCG/ACT inhaler Inhale 2 puffs into the lungs 2 (two) times daily.  Marland Kitchen Spacer/Aero-Holding Chambers (AEROCHAMBER PLUS) inhaler Use as instructed  . vitamin B-12 (CYANOCOBALAMIN) 1000 MCG tablet Take 1,000 mcg by mouth daily.  . [DISCONTINUED] fexofenadine-pseudoephedrine (ALLEGRA-D) 60-120 MG 12 hr tablet Take 1 tablet by mouth every 12 (twelve) hours.    Allergies:   Patient has no known allergies.  Review of Systems (ROS): Review of Systems  Constitutional: Negative for chills and fever.  Respiratory: Negative for cough and shortness of breath.  Cardiovascular: Negative for chest pain and palpitations.  Musculoskeletal:       Pain and swelling to LEFT shoulder x 2 weeks  Neurological: Positive for weakness (LUE 2/2 acute pain) and numbness (LUE 2/2 acute pain). Negative for dizziness and headaches.  Psychiatric/Behavioral: Positive for sleep disturbance (2/2 acute pain).  All other systems reviewed and are negative.    Vital Signs: Today's Vitals   05/07/19 1214 05/07/19 1215 05/07/19 1221 05/07/19 1317  BP:   117/74   Pulse:   69   Temp:   98.1 F (36.7 C)   TempSrc:   Oral   SpO2:   99%     Weight:  196 lb (88.9 kg)    Height:  5\' 4"  (1.626 m)    PainSc: 10-Worst pain ever   10-Worst pain ever    Physical Exam: Physical Exam  Constitutional: She is oriented to person, place, and time and well-developed, well-nourished, and in no distress.  HENT:  Head: Normocephalic and atraumatic.  Eyes: Pupils are equal, round, and reactive to light.  Cardiovascular: Normal rate, regular rhythm, normal heart sounds and intact distal pulses.  Pulmonary/Chest: Effort normal and breath sounds normal.  Musculoskeletal:     Left shoulder: Swelling (mild), tenderness and pain present. No deformity, effusion, crepitus or spasms. Decreased range of motion. Decreased strength. Normal pulse.     Cervical back: Full passive range of motion without pain and neck supple.     Comments: Strength in LEFT shoulder 4/5 with reported distal paresthesias. Top of shoulder TTP, with exquisite tenderness overlying the LEFT scapula/trapezius. (+) painful AROM; increases significantly with abduction. (+) PMS noted distally; temperature and capillary refill WNL.   Neurological: She is alert and oriented to person, place, and time. She has normal sensation and normal reflexes.  Skin: Skin is warm and dry. No rash noted. She is not diaphoretic.  Psychiatric: Mood, memory, affect and judgment normal.  Nursing note and vitals reviewed.   Urgent Care Treatments / Results:   Orders Placed This Encounter  Procedures  . DG Shoulder Left    LABS: PLEASE NOTE: all labs that were ordered this encounter are listed, however only abnormal results are displayed. Labs Reviewed - No data to display  EKG: -None  RADIOLOGY: DG Shoulder Left  Result Date: 05/07/2019 CLINICAL DATA:  Left shoulder pain 2 weeks with radicular pain. No recent injury. EXAM: LEFT SHOULDER - 2+ VIEW COMPARISON:  None. FINDINGS: No significant degenerative changes and no evidence of acute fracture or dislocation. There is an 8 mm oval density  projecting over the expected region of the distal supraspinatus tendon on the AP film which may represent calcification over the tendon which can be seen with calcific tendinitis. There is a smaller curvilinear calcific density also at the expected insertion of the supraspinatus tendon. IMPRESSION: No acute findings. Two focal areas of calcification the larger measuring 8 mm in the expected region of the distal supraspinatus tendon near its insertion which may be due to calcific tendinitis. Electronically Signed   By: Marin Olp M.D.   On: 05/07/2019 13:03    PROCEDURES: Procedures  MEDICATIONS RECEIVED THIS VISIT: Medications  ketorolac (TORADOL) 30 MG/ML injection 30 mg (30 mg Intramuscular Given 05/07/19 1314)    PERTINENT CLINICAL COURSE NOTES/UPDATES:   Initial Impression / Assessment and Plan / Urgent Care Course:  Pertinent labs & imaging results that were available during my care of the patient were personally reviewed by me and considered in my medical  decision making (see lab/imaging section of note for values and interpretations).  Grace Martinez is a 54 y.o. female who presents to [facility] today with complaints of Shoulder Pain  Patient is well appearing overall in clinic today. She does not appear to be in any acute distress. Presenting symptoms (see HPI) and exam as documented above. Pain is significant. No acute injuries; (+) remote injury stemming from fall x 2 years ago (never sought treatment). Diagnostic radiographs of the LEFT shoulder revealed no acute fracture, dislocation, or effusion. There were 2 areas felt to represent calcific tendonitis at the distal supraspinatus and at the insertion site of the supraspinatus tendon. This could account for patient's pain. Additionally, she is having radicular symptoms suggesting at least a degree of nerve impingement. Will treat with ketorolac 30 mg injection in clinic. Patient will go home on Medrol dose pack to help  reduce pain, inflammation, and hopefully reduce her radicular symptoms. She was educated on complimentary modalities to help with her pain. Patient encouraged to rest and avoid heavy lifting. Encouraged to apply moist heat and/or ice TID-QID for at least 15-20 minutes at a time; written information provided on today's AVS. Patient to take APAP for pain. Advised to avoid concurrent IBU doses while on steroids. Will send in Rx for short course of Norco for PRN use for severe pain.    If not improving with the prescribed interventions, patient will need to be seen for further evaluation by orthopedics. Discussed that further imaging may be warranted. Discussed possible treatment options including intra-articular steroid injections, long term NSAID therapy, and physiotherapy. If calcifications felt to be causing her pain, and she fails conservative management with the aforementioned interventions, surgical management may be considered as a last resort. She was encouraged to discuss treatment options with orthopedic provider if she ends up having to be seen. Name and office contact information provided on today's AVS for Dr. Kennedy Bucker. Patient advised the she will need to contact the office to schedule an appointment to be seen.   Current clinical condition warrants patient being out of work in order to recover from her current injury/illness. She was provided with the appropriate documentation to provide to her place of employment that will allow for her to RTW on 05/11/2019 with no restrictions.   I have reviewed the follow up and strict return precautions for any new or worsening symptoms. Patient is aware of symptoms that would be deemed urgent/emergent, and would thus require further evaluation either here or in the emergency department. At the time of discharge, she verbalized understanding and consent with the discharge plan as it was reviewed with her. All questions were fielded by provider and/or clinic  staff prior to patient discharge.    Final Clinical Impressions / Urgent Care Diagnoses:   Final diagnoses:  Calcific tendonitis of left shoulder  Radicular pain of left upper extremity    New Prescriptions:  [place] Controlled Substance Registry consulted? Yes, I have consulted the [place] Controlled Substances Registry for this patient, and feel the risk/benefit ratio today is favorable for proceeding with this prescription for a controlled substance.  . Discussed use of controlled substance medication to treat her acute pain.  o Reviewed [place] STOP Act regulations  o Clinic does not refill controlled substances over the phone without face to face evaluation.  . Safety precautions reviewed.  o Medications should not be bitten, chewed, sold, or taken with alcohol.  o Avoid use while working, driving, or operating  heavy machinery.  o Side effects associated with the use of this particular medication reviewed. - Patient understands that this medication can cause CNS depression, increase her risk of falls, and even lead to overdose that may result in death, if used outside of the parameters that she and I discussed.  With all of this in mind, she knowingly accepts the risks and responsibilities associated with intended course of treatment, and elects to responsibly proceed as discussed.  Meds ordered this encounter  Medications  . ketorolac (TORADOL) 30 MG/ML injection 30 mg  . methylPREDNISolone (MEDROL DOSEPAK) 4 MG TBPK tablet    Sig: Take by mouth daily - taper daily dose per package instructions.    Dispense:  21 tablet    Refill:  0  . HYDROcodone-acetaminophen (NORCO) 5-325 MG tablet    Sig: Take 1 tablet by mouth 2 (two) times daily as needed for moderate pain.    Dispense:  8 tablet    Refill:  0    Recommended Follow up Care:  Patient encouraged to follow up with the following provider within the specified time frame, or sooner as dictated by the severity of her symptoms. As always,  she was instructed that for any urgent/emergent care needs, she should seek care either here or in the emergency department for more immediate evaluation.  Follow-up Information    Kennedy Bucker, MD In 1 week.   Specialty: Orthopedic Surgery Why: General reassessment of symptoms if not improving Contact information: [address] [facility] [place] [address] [phone]         NOTE: This note was prepared using Dragon dictation software along with smaller phrase technology. Despite my best ability to proofread, there is the potential that transcriptional errors may still occur from this process, and are completely unintentional.    Verlee Monte, NP 05/08/19 2057

## 2019-05-07 NOTE — ED Triage Notes (Signed)
Pt presents with c/o "severe" left shoulder pain increasing over 2 weeks. She reports an injury d/t fall about 2 years ago. She has not had surgery on the shoulder. She denies any recent re-injury. She does have decreased ROM. She does report some swelling to the area and a possible "knot" in the rear of her shoulder.

## 2019-07-09 ENCOUNTER — Other Ambulatory Visit: Payer: Self-pay

## 2019-07-09 ENCOUNTER — Encounter: Payer: Self-pay | Admitting: Emergency Medicine

## 2019-07-09 ENCOUNTER — Ambulatory Visit
Admission: EM | Admit: 2019-07-09 | Discharge: 2019-07-09 | Disposition: A | Discharge status: Home / Self Care | Payer: 59 | Attending: Emergency Medicine | Admitting: Emergency Medicine

## 2019-07-09 DIAGNOSIS — R519 Headache, unspecified: Secondary | ICD-10-CM

## 2019-07-09 MED ORDER — BUTALBITAL-APAP-CAFFEINE 50-325-40 MG PO TABS: 1.0000 | ORAL_TABLET | Freq: Four times a day (QID) | ORAL | 0 refills | Status: AC | PRN

## 2019-07-09 MED ORDER — KETOROLAC TROMETHAMINE 60 MG/2ML IM SOLN
60.0000 mg | Freq: Once | INTRAMUSCULAR | Status: AC
  Administered 2019-07-09: 60 mg via INTRAMUSCULAR

## 2019-07-09 MED ORDER — FLUTICASONE PROPIONATE 50 MCG/ACT NA SUSP: 2.0000 | Freq: Every day | NASAL | 0 refills | Status: AC

## 2019-07-09 NOTE — ED Triage Notes (Signed)
Pt c/o headaches. She states for the last week. She wakes up with the headaches. She states it feels like pressure behind her eyes. She has tried Advil migraine and it helps for a while but then it comes back. She tested positive for covid back in February and feels like she has a sinus infection.

## 2019-07-09 NOTE — Discharge Instructions (Signed)
Use Flonase daily.  Take Allegra Claritin or Zyrtec daily for the next 2 weeks.  Use caution when taking the Fioricet.  Do not drive or perform activities requiring concentration or judgment while taking the medication.  Recommend following up with your primary care physician.

## 2019-07-09 NOTE — ED Provider Notes (Signed)
[facility]    CSN: [id number] Arrival date & time: 07/09/19  1311      History   Chief Complaint Chief Complaint  Patient presents with  . Headache    HPI Grace Martinez is a 54 y.o. female.   HPI  54 year old female presents with complaint of headaches.  She states that has been present for over a week.  She states that she wakes up with headaches.  She complains that she has pressure behind her eyes.  Her eyes have also been watering.  She has tried Advil migraine; it helps for a very short period time and then returns.  Tested positive for Covid back in February.  She denies any fever or chills.  She has had no drainage from her nose.  She states that the pain is more of a pressure sensation.  She denies any photophobia or nausea or vomiting.  She states she has a history of migraines but this does not feel the same.  She is concerned that she may have a sinus infection.  Vital signs showed a temperature of 98.2 pulse of 68 respirations 18 blood pressure 121/83 O2 sats at 100%.        Past Medical History:  Diagnosis Date  . Asthma   . Depression   . Migraines     There are no problems to display for this patient.   Past Surgical History:  Procedure Laterality Date  . TUBAL LIGATION      OB History   No obstetric history on file.      Home Medications    Prior to Admission medications   Medication Sig Start Date End Date Taking? Authorizing Provider  albuterol (VENTOLIN HFA) 108 (90 Base) MCG/ACT inhaler Inhale 2 puffs into the lungs as needed. 02/05/19  Yes [provider]  buPROPion (WELLBUTRIN XL) 300 MG 24 hr tablet Take 300 mg by mouth daily.   Yes [provider]  cholecalciferol (VITAMIN D) 1000 units tablet Take 1,000 Units by mouth daily.   Yes [provider]  citalopram (CELEXA) 10 MG tablet Take 10 mg by mouth daily.   Yes [provider]  eletriptan (RELPAX) 20 MG tablet Take 20 mg by mouth as  needed for migraine or headache. May repeat in 2 hours if headache persists or recurs.   Yes [provider]  ibuprofen (ADVIL,MOTRIN) 200 MG tablet Take 200 mg by mouth every 6 (six) hours as needed.   Yes [provider]  montelukast (SINGULAIR) 10 MG tablet Take by mouth. 08/23/17  Yes [provider]  QVAR REDIHALER 80 MCG/ACT inhaler Inhale 2 puffs into the lungs 2 (two) times daily. 12/03/18  Yes [provider]  butalbital-acetaminophen-caffeine (FIORICET) 50-325-40 MG tablet Take 1 tablet by mouth every 6 (six) hours as needed for headache. 07/09/19 07/08/20  Lutricia Feil, PA-C  fluticasone (FLONASE) 50 MCG/ACT nasal spray Place 2 sprays into both nostrils daily. 07/09/19   Lutricia Feil, PA-C    Family History Family History  Problem Relation Age of Onset  . Breast cancer Neg Hx     Social History Social History   Tobacco Use  . Smoking status: Never Smoker  . Smokeless tobacco: Never Used  Substance Use Topics  . Alcohol use: Yes    Comment: occasional  . Drug use: No     Allergies   Patient has no known allergies.   Review of Systems Review of Systems  Constitutional: Positive for  activity change. Negative for appetite change, chills, diaphoresis, fatigue and fever.  HENT: Positive for sinus pressure and sinus pain.   Respiratory: Negative for cough and shortness of breath.   Neurological: Negative for dizziness.  All other systems reviewed and are negative.    Physical Exam Triage Vital Signs ED Triage Vitals  Enc Vitals Group     BP 07/09/19 1343 121/83     Pulse Rate 07/09/19 1343 68     Resp 07/09/19 1343 18     Temp 07/09/19 1343 98.2 F (36.8 C)     Temp Source 07/09/19 1343 Oral     SpO2 07/09/19 1343 100 %     Weight 07/09/19 1338 192 lb (87.1 kg)     Height 07/09/19 1338 5\' 4"  (1.626 m)     Head Circumference --      Peak Flow --      Pain Score 07/09/19 1336 6     Pain Loc --      Pain Edu? --       Excl. in GC? --    No data found.  Updated Vital Signs BP 121/83 (BP Location: Right Arm)   Pulse 68   Temp 98.2 F (36.8 C) (Oral)   Resp 18   Ht 5\' 4"  (1.626 m)   Wt 192 lb (87.1 kg)   LMP 06/14/2019 (Approximate)   SpO2 100%   BMI 32.96 kg/m   Visual Acuity Right Eye Distance:   Left Eye Distance:   Bilateral Distance:    Right Eye Near:   Left Eye Near:    Bilateral Near:     Physical Exam Vitals and nursing note reviewed.  Constitutional:      General: She is not in acute distress.    Appearance: She is well-developed. She is obese. She is not ill-appearing or toxic-appearing.  HENT:     Head: Normocephalic and atraumatic.     Mouth/Throat:     Mouth: Mucous membranes are moist.     Pharynx: Oropharynx is clear.  Eyes:     Pupils: Pupils are equal, round, and reactive to light.  Cardiovascular:     Rate and Rhythm: Normal rate and regular rhythm.     Heart sounds: Normal heart sounds.  Pulmonary:     Breath sounds: Normal breath sounds.  Musculoskeletal:        General: Normal range of motion.     Cervical back: Normal range of motion and neck supple.  Skin:    General: Skin is warm and dry.  Neurological:     Mental Status: She is alert and oriented to person, place, and time.  Psychiatric:        Mood and Affect: Mood normal.        Speech: Speech normal.        Behavior: Behavior normal.      UC Treatments / Results  Labs (all labs ordered are listed, but only abnormal results are displayed) Labs Reviewed - No data to display  EKG   Radiology No results found.  Procedures Procedures (including critical care time)  Medications Ordered in UC Medications  ketorolac (TORADOL) injection 60 mg (60 mg Intramuscular Given 07/09/19 1415)   The patient had a decrease in her headache pain from a 5 to a 2 at the time of discharge.  Initial Impression / Assessment and Plan / UC Course  I have reviewed the triage vital signs and the nursing  notes.  Pertinent labs &  imaging results that were available during my care of the patient were reviewed by me and considered in my medical decision making (see chart for details).   54 year old female presents with headaches that she has had since last week.  She states that the headache is mostly around her eyes and behind her eyes.  Described as a pressure or pain.  Says she wakes up with it.  She has tried Advil migraine and helps for short period time and then her headache returns.  She is recovering from Covid in February.  Has had no sinus drainage.  She has had no fever or chills.  Examination showed significant tenderness to percussion over the maxillary sinuses and less so over the frontal.  Manger of her exam was normal.  She was given Toradol injection 60 mg intramuscular.  Her pain level at the time of injection was 5 and about 15 to 30 minutes later her pain had decreased to a 2.  She states that she did feel better.  It is likely that she has a sinus headache.  I do not believe that she has a sinus infection.  Give her Fioricet to take for severe pain.  Also recommended the use of Flonase nasal spray daily for the next [redacted] weeks along with Allegra Zyrtec or Claritin.  Is not improving she should follow-up with her primary care physician.  Final Clinical Impressions(s) / UC Diagnoses   Final diagnoses:  Acute nonintractable headache, unspecified headache type     Discharge Instructions     Use Flonase daily.  Take Allegra Claritin or Zyrtec daily for the next 2 weeks.  Use caution when taking the Fioricet.  Do not drive or perform activities requiring concentration or judgment while taking the medication.  Recommend following up with your primary care physician.    ED Prescriptions    Medication Sig Dispense Auth. Provider   butalbital-acetaminophen-caffeine (FIORICET) 50-325-40 MG tablet Take 1 tablet by mouth every 6 (six) hours as needed for headache. 20 tablet Crecencio Mc  P, PA-C   fluticasone (FLONASE) 50 MCG/ACT nasal spray Place 2 sprays into both nostrils daily. 16 g Lorin Picket, PA-C     PDMP not reviewed this encounter.   Lorin Picket, PA-C 07/09/19 1452

## 2019-08-01 ENCOUNTER — Ambulatory Visit: Payer: 59 | Attending: Internal Medicine

## 2019-08-01 DIAGNOSIS — Z23 Encounter for immunization: Secondary | ICD-10-CM

## 2019-08-01 NOTE — Progress Notes (Signed)
   Covid-19 Vaccination Clinic  Name:  Lelar Farewell    MRN: [id number] DOB: 21-Apr-1965  08/01/2019  Ms. Ellithorpe was observed post Covid-19 immunization for 15 minutes without incident. She was provided with Vaccine Information Sheet and instruction to access the V-Safe system.   Ms. Orengo was instructed to call 911 with any severe reactions post vaccine: Marland Kitchen Difficulty breathing  . Swelling of face and throat  . A fast heartbeat  . A bad rash all over body  . Dizziness and weakness   Immunizations Administered    Name Date Dose VIS Date Route   Pfizer COVID-19 Vaccine 08/01/2019  1:37 PM 0.3 mL 03/27/2019 Intramuscular   Manufacturer: [employer], [employer]   Lot: [id number]   NDC: [address]-6

## 2019-08-25 ENCOUNTER — Ambulatory Visit: Payer: 59 | Attending: Internal Medicine

## 2019-08-25 DIAGNOSIS — Z23 Encounter for immunization: Secondary | ICD-10-CM

## 2019-08-25 NOTE — Progress Notes (Signed)
   Covid-19 Vaccination Clinic  Name:  Grace Martinez    MRN: [id number] DOB: [birth year]  08/25/2019  [name] was observed post Covid-19 immunization for 15 minutes without incident. She was provided with Vaccine Information Sheet and instruction to access the V-Safe system.   [name] was instructed to call 911 with any severe reactions post vaccine: Marland Kitchen Difficulty breathing  . Swelling of face and throat  . A fast heartbeat  . A bad rash all over body  . Dizziness and weakness   Immunizations Administered    Name Date Dose VIS Date Route   Pfizer COVID-19 Vaccine 08/25/2019  3:52 PM 0.3 mL 06/10/2018 Intramuscular   Manufacturer: [employer], [employer]   Lot: [id number]   NDC: [address]-6

## 2020-07-08 ENCOUNTER — Other Ambulatory Visit: Payer: Self-pay

## 2020-07-08 ENCOUNTER — Encounter: Payer: Self-pay | Admitting: Emergency Medicine

## 2020-07-08 ENCOUNTER — Ambulatory Visit
Admission: EM | Admit: 2020-07-08 | Discharge: 2020-07-08 | Disposition: A | Discharge status: Home / Self Care | Payer: 59 | Attending: Family Medicine | Admitting: Family Medicine

## 2020-07-08 DIAGNOSIS — J0100 Acute maxillary sinusitis, unspecified: Secondary | ICD-10-CM

## 2020-07-08 DIAGNOSIS — H6502 Acute serous otitis media, left ear: Secondary | ICD-10-CM | POA: Diagnosis not present

## 2020-07-08 MED ORDER — CETIRIZINE-PSEUDOEPHEDRINE ER 5-120 MG PO TB12: 1.0000 | ORAL_TABLET | Freq: Two times a day (BID) | ORAL | 0 refills | Status: AC

## 2020-07-08 MED ORDER — AMOXICILLIN-POT CLAVULANATE 875-125 MG PO TABS: 1.0000 | ORAL_TABLET | Freq: Two times a day (BID) | ORAL | 0 refills | Status: DC

## 2020-07-08 NOTE — ED Provider Notes (Signed)
[facility]    CSN: [id number] Arrival date & time: 07/08/20  1354  History   Chief Complaint Chief Complaint  Patient presents with  . Cough  . Nasal Congestion   HPI  55 year old female presents with the above complaints.  Started Tuesday. Reports sinus pressure/congestion. Associated cough. No fever. No relief with OTC cough/cold medication. No reported sick contacts. No other associated symptoms. No other complaints.  Past Medical History:  Diagnosis Date  . Asthma   . Depression   . Migraines    Past Surgical History:  Procedure Laterality Date  . TUBAL LIGATION     OB History   No obstetric history on file.    Home Medications    Prior to Admission medications   Medication Sig Start Date End Date Taking? Authorizing Provider  amoxicillin-clavulanate (AUGMENTIN) 875-125 MG tablet Take 1 tablet by mouth 2 (two) times daily. 07/08/20  Yes Torryn Hudspeth G, DO  cetirizine-pseudoephedrine (ZYRTEC-D) 5-120 MG tablet Take 1 tablet by mouth 2 (two) times daily. 07/08/20  Yes Constance Hackenberg G, DO  albuterol (VENTOLIN HFA) 108 (90 Base) MCG/ACT inhaler Inhale 2 puffs into the lungs as needed. 02/05/19   [provider]  buPROPion (WELLBUTRIN XL) 300 MG 24 hr tablet Take 300 mg by mouth daily.    [provider]  butalbital-acetaminophen-caffeine (FIORICET) [phone] MG tablet Take 1 tablet by mouth every 6 (six) hours as needed for headache. 07/09/19 07/08/20  Lutricia Feil, PA-C  cholecalciferol (VITAMIN D) 1000 units tablet Take 1,000 Units by mouth daily.    [provider]  citalopram (CELEXA) 10 MG tablet Take 10 mg by mouth daily.    [provider]  eletriptan (RELPAX) 20 MG tablet Take 20 mg by mouth as needed for migraine or headache. May repeat in 2 hours if headache persists or recurs.    [provider]  fluticasone (FLONASE) 50 MCG/ACT nasal spray Place 2 sprays into both nostrils daily. 07/09/19   Lutricia Feil, PA-C  ibuprofen (ADVIL,MOTRIN) 200 MG tablet Take 200 mg by mouth every 6 (six) hours as needed.    [provider]  montelukast (SINGULAIR) 10 MG tablet Take by mouth. 08/23/17   [provider]  QVAR REDIHALER 80 MCG/ACT inhaler Inhale 2 puffs into the lungs 2 (two) times daily. 12/03/18   [provider]    Family History Family History  Problem Relation Age of Onset  . Breast cancer Neg Hx     Social History Social History   Tobacco Use  . Smoking status: Never Smoker  . Smokeless tobacco: Never Used  Vaping Use  . Vaping Use: Never used  Substance Use Topics  . Alcohol use: Yes    Comment: occasional  . Drug use: No     Allergies   Topiramate   Review of Systems Review of Systems Per HPI  Physical Exam Triage Vital Signs ED Triage Vitals  Enc Vitals Group     BP 07/08/20 1454 121/78     Pulse Rate 07/08/20 1454 72     Resp 07/08/20 1454 18     Temp --      Temp Source 07/08/20 1454 Oral     SpO2 07/08/20 1454 98 %     Weight --      Height --      Head Circumference --      Peak Flow --      Pain Score 07/08/20 1453 0  Pain Loc --      Pain Edu? --      Excl. in GC? --    Updated Vital Signs BP 121/78 (BP Location: Left Arm)   Pulse 72   Resp 18   LMP 06/21/2020   SpO2 98%   Visual Acuity Right Eye Distance:   Left Eye Distance:   Bilateral Distance:    Right Eye Near:   Left Eye Near:    Bilateral Near:     Physical Exam Vitals and nursing note reviewed.  Constitutional:      General: She is not in acute distress.    Appearance: Normal appearance. She is not ill-appearing.  HENT:     Head: Normocephalic and atraumatic.     Right Ear: Tympanic membrane normal.     Ears:     Comments: Left TM with erythema and effusion.    Nose: Congestion present.     Mouth/Throat:     Pharynx: Oropharynx is clear. No oropharyngeal exudate.  Eyes:     General:        Right eye: No discharge.        Left eye: No  discharge.     Conjunctiva/sclera: Conjunctivae normal.  Cardiovascular:     Rate and Rhythm: Normal rate and regular rhythm.     Heart sounds: No murmur heard.   Pulmonary:     Effort: Pulmonary effort is normal.     Breath sounds: Normal breath sounds. No wheezing, rhonchi or rales.  Neurological:     Mental Status: She is alert.  Psychiatric:        Mood and Affect: Mood normal.        Behavior: Behavior normal.    UC Treatments / Results  Labs (all labs ordered are listed, but only abnormal results are displayed) Labs Reviewed - No data to display  EKG   Radiology No results found.  Procedures Procedures (including critical care time)  Medications Ordered in UC Medications - No data to display  Initial Impression / Assessment and Plan / UC Course  I have reviewed the triage vital signs and the nursing notes.  Pertinent labs & imaging results that were available during my care of the patient were reviewed by me and considered in my medical decision making (see chart for details).    55 year old female presents with sinusitis (likely viral) & serous otitis media.  Treating with Augmentin and Zyrtec D.  Final Clinical Impressions(s) / UC Diagnoses   Final diagnoses:  Acute maxillary sinusitis, recurrence not specified  Acute serous otitis media of left ear, recurrence not specified   Discharge Instructions   None    ED Prescriptions    Medication Sig Dispense Auth. Provider   amoxicillin-clavulanate (AUGMENTIN) 875-125 MG tablet Take 1 tablet by mouth 2 (two) times daily. 20 tablet Rayford Williamsen G, DO   cetirizine-pseudoephedrine (ZYRTEC-D) 5-120 MG tablet Take 1 tablet by mouth 2 (two) times daily. 30 tablet Tommie Sams, DO     PDMP not reviewed this encounter.   Tommie Sams, [place] 07/08/20 1732

## 2020-07-08 NOTE — ED Triage Notes (Signed)
Pt states that she has a cough and nasal congestion that started Tuesday.

## 2020-09-13 ENCOUNTER — Ambulatory Visit (LOCAL_COMMUNITY_HEALTH_CENTER): Payer: 59

## 2020-09-13 ENCOUNTER — Other Ambulatory Visit: Payer: Self-pay

## 2020-09-13 DIAGNOSIS — Z111 Encounter for screening for respiratory tuberculosis: Secondary | ICD-10-CM

## 2020-09-16 ENCOUNTER — Ambulatory Visit (LOCAL_COMMUNITY_HEALTH_CENTER): Payer: 59

## 2020-09-16 ENCOUNTER — Other Ambulatory Visit: Payer: Self-pay

## 2020-09-16 DIAGNOSIS — Z111 Encounter for screening for respiratory tuberculosis: Secondary | ICD-10-CM

## 2020-09-16 LAB — TB SKIN TEST
Induration: 0 mm
TB Skin Test: NEGATIVE

## 2020-10-16 IMAGING — CR DG SHOULDER 2+V*L*
3 series · 3 of 3 positions shown · non-contrast
Comparison: None.

CLINICAL DATA: Left shoulder pain 2 weeks with radicular pain. No
recent injury.

EXAM:
LEFT SHOULDER - 2+ VIEW

[shoulder grashey]
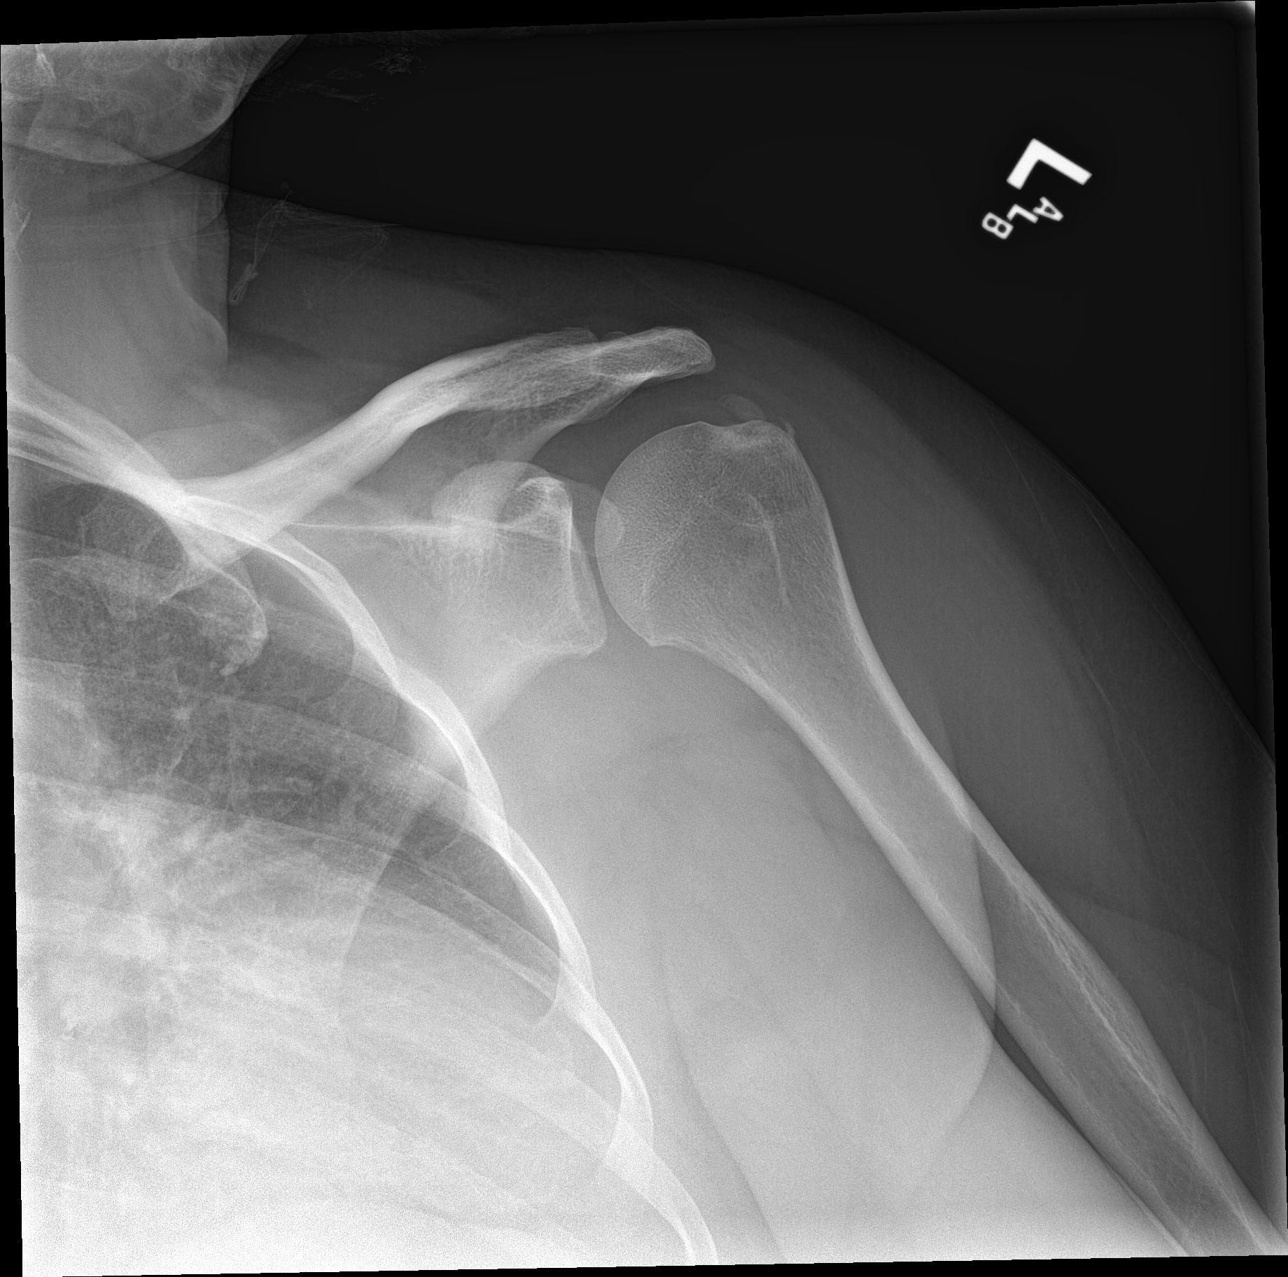

[shoulder y view]
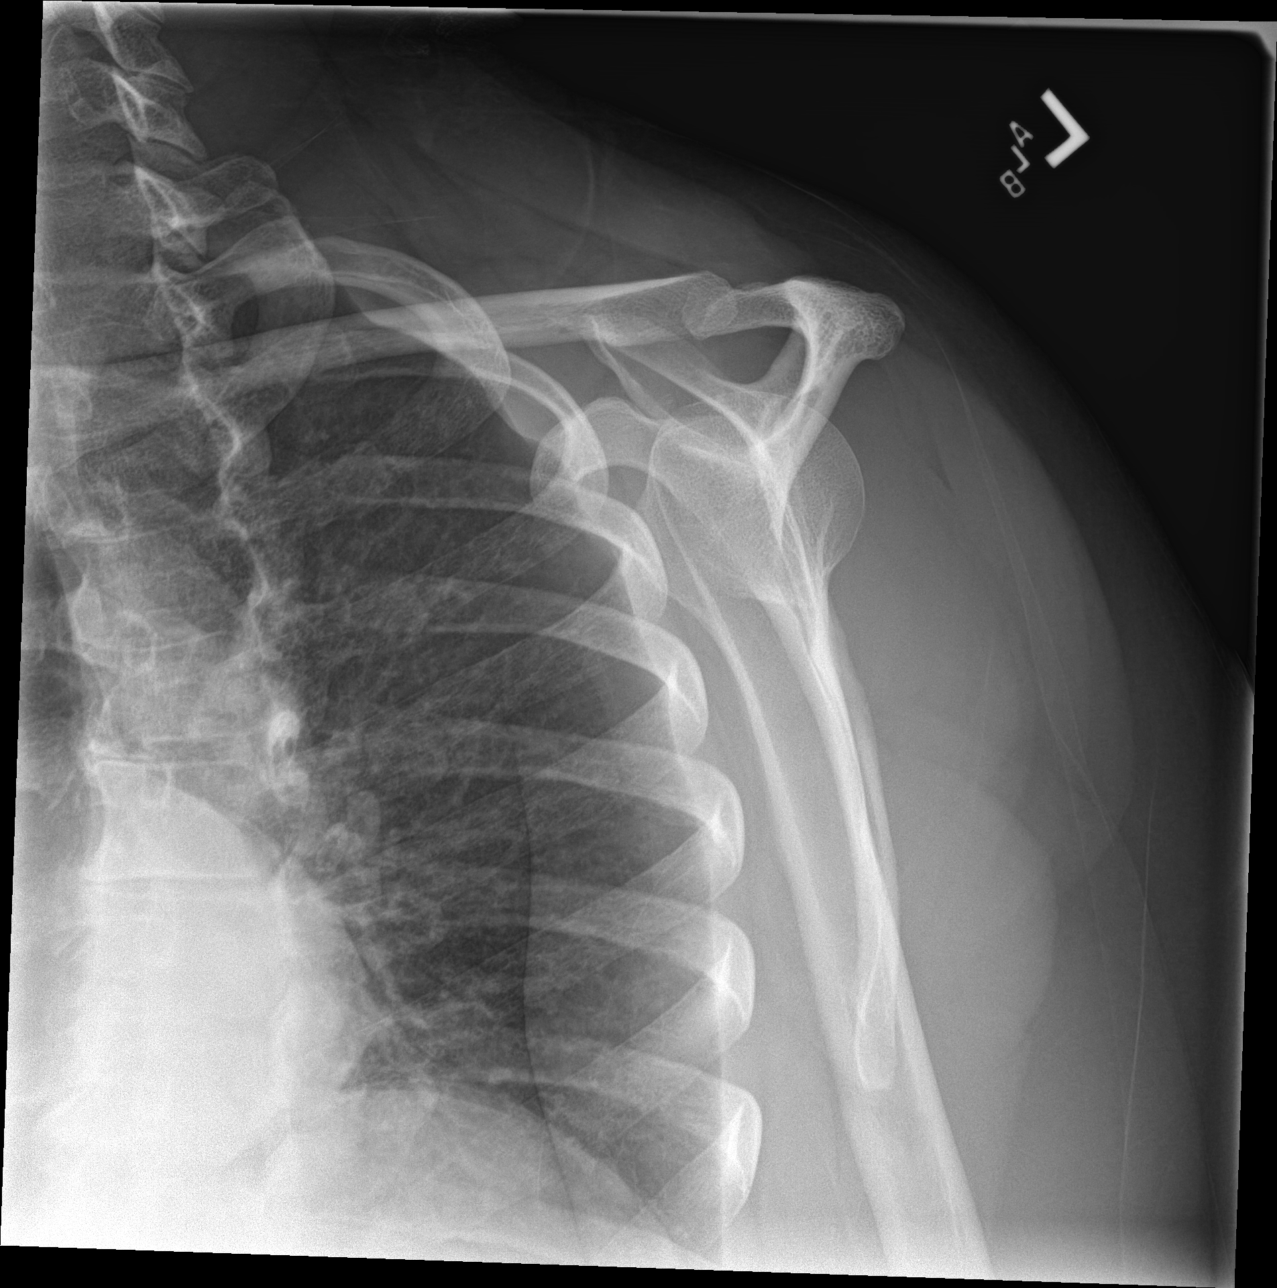

[shoulder axial]
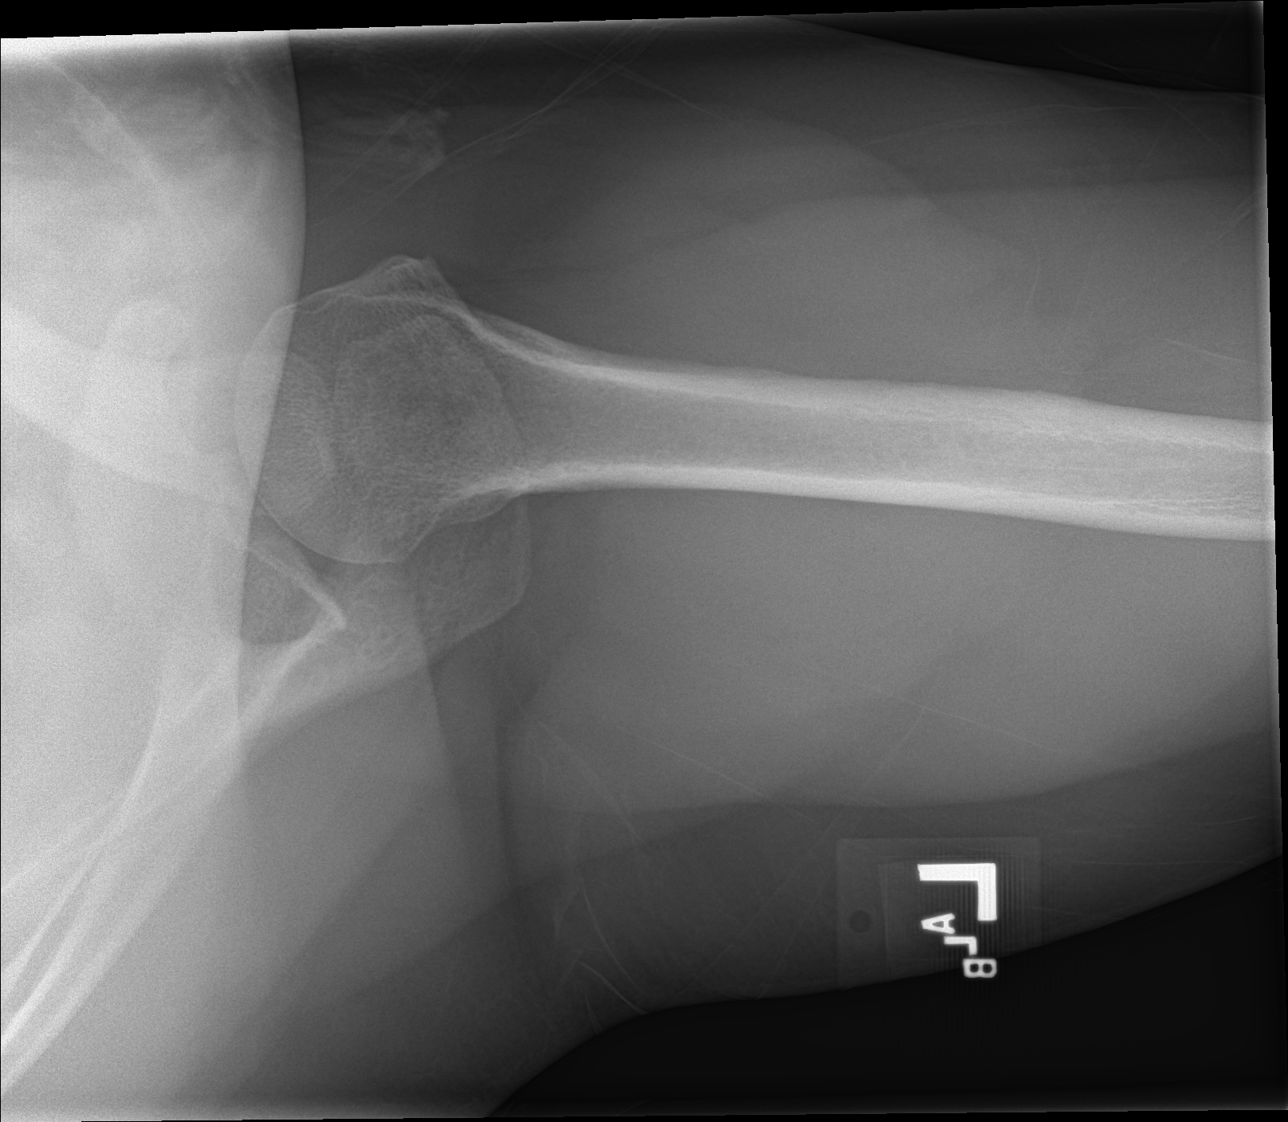

[3 of 3 positions shown; findings below may reference images not displayed]

FINDINGS: No significant degenerative changes and no evidence of acute
fracture or dislocation. There is an 8 mm oval density projecting
over the expected region of the distal supraspinatus tendon on the
AP film which may represent calcification over the tendon which can
be seen with calcific tendinitis. There is a smaller curvilinear
calcific density also at the expected insertion of the supraspinatus
tendon.
IMPRESSION: No acute findings.

Two focal areas of calcification the larger measuring 8 mm in the
expected region of the distal supraspinatus tendon near its
insertion which may be due to calcific tendinitis.

## 2022-08-29 ENCOUNTER — Other Ambulatory Visit: Payer: Self-pay

## 2022-08-29 DIAGNOSIS — Z1231 Encounter for screening mammogram for malignant neoplasm of breast: Secondary | ICD-10-CM

## 2022-09-20 ENCOUNTER — Ambulatory Visit
Admission: RE | Admit: 2022-09-20 | Discharge: 2022-09-20 | Disposition: A | Discharge status: Home / Self Care | Payer: 59 | Source: Ambulatory Visit | Attending: Family Medicine | Admitting: Family Medicine

## 2022-09-20 DIAGNOSIS — Z1231 Encounter for screening mammogram for malignant neoplasm of breast: Secondary | ICD-10-CM

## 2023-01-09 LAB — COLOGUARD: COLOGUARD: NEGATIVE

## 2023-01-09 LAB — EXTERNAL GENERIC LAB PROCEDURE: COLOGUARD: NEGATIVE

## 2023-03-24 ENCOUNTER — Ambulatory Visit
Admission: EM | Admit: 2023-03-24 | Discharge: 2023-03-24 | Disposition: A | Discharge status: Home / Self Care | Payer: 59 | Attending: Emergency Medicine | Admitting: Emergency Medicine

## 2023-03-24 DIAGNOSIS — J22 Unspecified acute lower respiratory infection: Secondary | ICD-10-CM | POA: Diagnosis not present

## 2023-03-24 MED ORDER — PREDNISONE 50 MG PO TABS: 50.0000 mg | ORAL_TABLET | Freq: Every day | ORAL | 0 refills | Status: AC

## 2023-03-24 MED ORDER — DOXYCYCLINE HYCLATE 100 MG PO CAPS: 100.0000 mg | ORAL_CAPSULE | Freq: Two times a day (BID) | ORAL | 0 refills | Status: AC

## 2023-03-24 MED ORDER — PROMETHAZINE-DM 6.25-15 MG/5ML PO SYRP: 5.0000 mL | ORAL_SOLUTION | Freq: Four times a day (QID) | ORAL | 0 refills | Status: AC | PRN

## 2023-03-24 NOTE — ED Triage Notes (Signed)
Pt c/o fatigue, productive cough, body chills, chest congestion, x4days  Pt states that she has asthma  Pt states that she can hear "Rattling" in her chest.

## 2023-03-24 NOTE — ED Provider Notes (Signed)
[facility]    CSN: [id number] Arrival date & time: 03/24/23  1521      History   Chief Complaint Chief Complaint  Patient presents with   Cough   Nasal Congestion    HPI Grace Martinez is a 57 y.o. female.   57 year old female, Grace Martinez, presents to urgent care for evaluation of worsening cough and nasal congestion with fatigue and bodyaches for 4 days.  Patient has a history of asthma.  The history is provided by the patient. No language interpreter was used.    Past Medical History:  Diagnosis Date   Asthma    Depression    Migraines     Patient Active Problem List   Diagnosis Date Noted   Acute respiratory infection 03/24/2023    Past Surgical History:  Procedure Laterality Date   TUBAL LIGATION      OB History   No obstetric history on file.      Home Medications    Prior to Admission medications   Medication Sig Start Date End Date Taking? Authorizing Provider  albuterol (VENTOLIN HFA) 108 (90 Base) MCG/ACT inhaler Inhale 2 puffs into the lungs as needed. 02/05/19  Yes [provider]  cholecalciferol (VITAMIN D) 1000 units tablet Take 1,000 Units by mouth daily.   Yes [provider]  citalopram (CELEXA) 10 MG tablet Take 10 mg by mouth daily.   Yes [provider]  doxycycline (VIBRAMYCIN) 100 MG capsule Take 1 capsule (100 mg total) by mouth 2 (two) times daily for 7 days. 03/24/23 03/31/23 Yes Derrico Zhong, Para March, NP  eletriptan (RELPAX) 20 MG tablet Take 20 mg by mouth as needed for migraine or headache. May repeat in 2 hours if headache persists or recurs.   Yes [provider]  ibuprofen (ADVIL,MOTRIN) 200 MG tablet Take 200 mg by mouth every 6 (six) hours as needed.   Yes [provider]  montelukast (SINGULAIR) 10 MG tablet Take by mouth. 08/23/17  Yes [provider]  predniSONE (DELTASONE) 50 MG tablet Take 1 tablet (50 mg total) by mouth daily with breakfast for 4 days.  03/24/23 03/28/23 Yes Bernardette Waldron, Para March, NP  promethazine-dextromethorphan (PROMETHAZINE-DM) 6.25-15 MG/5ML syrup Take 5 mLs by mouth 4 (four) times daily as needed for cough. 03/24/23  Yes Wana Mount, Para March, NP  QVAR REDIHALER 80 MCG/ACT inhaler Inhale 2 puffs into the lungs 2 (two) times daily. 12/03/18  Yes [provider]  cetirizine-pseudoephedrine (ZYRTEC-D) 5-120 MG tablet Take 1 tablet by mouth 2 (two) times daily. 07/08/20   Tommie Sams, DO  fluticasone (FLONASE) 50 MCG/ACT nasal spray Place 2 sprays into both nostrils daily. 07/09/19   Lutricia Feil, PA-C    Family History Family History  Problem Relation Age of Onset   Uterine cancer Mother    Breast cancer Neg Hx     Social History Social History   Tobacco Use   Smoking status: Never   Smokeless tobacco: Never  Vaping Use   Vaping status: Never Used  Substance Use Topics   Alcohol use: Yes    Comment: occasional   Drug use: No     Allergies   Topiramate   Review of Systems Review of Systems  Constitutional:  Positive for chills. Negative for fever.  HENT:  Positive for congestion.   Respiratory:  Positive for cough. Negative for shortness of breath, wheezing and stridor.   Musculoskeletal:  Positive for myalgias.  All other systems reviewed and are negative.  Physical Exam Triage Vital Signs ED Triage Vitals  Encounter Vitals Group     BP 03/24/23 1551 98/76     Systolic BP Percentile --      Diastolic BP Percentile --      Pulse Rate 03/24/23 1551 93     Resp --      Temp 03/24/23 1551 98.1 F (36.7 C)     Temp Source 03/24/23 1551 Oral     SpO2 03/24/23 1551 98 %     Weight 03/24/23 1549 190 lb (86.2 kg)     Height 03/24/23 1549 5\' 4"  (1.626 m)     Head Circumference --      Peak Flow --      Pain Score 03/24/23 1549 0     Pain Loc --      Pain Education --      Exclude from Growth Chart --    No data found.  Updated Vital Signs BP 98/76 (BP Location: Left Arm)   Pulse  93   Temp 98.1 F (36.7 C) (Oral)   Ht 5\' 4"  (1.626 m)   Wt 190 lb (86.2 kg)   SpO2 98%   BMI 32.61 kg/m   Visual Acuity Right Eye Distance:   Left Eye Distance:   Bilateral Distance:    Right Eye Near:   Left Eye Near:    Bilateral Near:     Physical Exam Vitals and nursing note reviewed.  Constitutional:      General: She is not in acute distress.    Appearance: She is well-developed.  HENT:     Head: Normocephalic.     Right Ear: Tympanic membrane is retracted.     Left Ear: Tympanic membrane is retracted.     Nose: Congestion present.     Mouth/Throat:     Lips: Pink.     Mouth: Mucous membranes are moist.     Pharynx: Oropharynx is clear.  Eyes:     General: Lids are normal.     Conjunctiva/sclera: Conjunctivae normal.     Pupils: Pupils are equal, round, and reactive to light.  Neck:     Trachea: No tracheal deviation.  Cardiovascular:     Rate and Rhythm: Normal rate and regular rhythm.     Pulses: Normal pulses.     Heart sounds: Normal heart sounds. No murmur heard. Pulmonary:     Effort: Pulmonary effort is normal.     Breath sounds: Normal breath sounds and air entry.  Abdominal:     General: Bowel sounds are normal.     Palpations: Abdomen is soft.     Tenderness: There is no abdominal tenderness.  Musculoskeletal:        General: Normal range of motion.     Cervical back: Normal range of motion.  Lymphadenopathy:     Cervical: No cervical adenopathy.  Skin:    General: Skin is warm and dry.     Findings: No rash.  Neurological:     General: No focal deficit present.     Mental Status: She is alert and oriented to person, place, and time.     GCS: GCS eye subscore is 4. GCS verbal subscore is 5. GCS motor subscore is 6.  Psychiatric:        Speech: Speech normal.        Behavior: Behavior normal. Behavior is cooperative.      UC Treatments / Results  Labs (all labs ordered are listed, but only  abnormal results are displayed) Labs  Reviewed - No data to display  EKG   Radiology No results found.  Procedures Procedures (including critical care time)  Medications Ordered in UC Medications - No data to display  Initial Impression / Assessment and Plan / UC Course  I have reviewed the triage vital signs and the nursing notes.  Pertinent labs & imaging results that were available during my care of the patient were reviewed by me and considered in my medical decision making (see chart for details).    Discussed exam findings and plan of care with patient, strict go to ER precautions given.   Patient verbalized understanding to this provider.  Ddx: Acute respiratory infection, viral illness,allergies Final Clinical Impressions(s) / UC Diagnoses   Final diagnoses:  Acute respiratory infection     Discharge Instructions      Rest,push fluids, take antibiotic,prednisone,cough med as prescribed.  May try using over the counter throat lozenges, mucinex or sudafed as label directed, hot tea, honey for cough.   Any antibiotic may cause upset stomach, resistance, photosensitivity     ED Prescriptions     Medication Sig Dispense Auth. Provider   promethazine-dextromethorphan (PROMETHAZINE-DM) 6.25-15 MG/5ML syrup Take 5 mLs by mouth 4 (four) times daily as needed for cough. 118 mL Ellery Tash, NP   predniSONE (DELTASONE) 50 MG tablet Take 1 tablet (50 mg total) by mouth daily with breakfast for 4 days. 4 tablet Leyanna Bittman, NP   doxycycline (VIBRAMYCIN) 100 MG capsule Take 1 capsule (100 mg total) by mouth 2 (two) times daily for 7 days. 14 capsule Autumm Hattery, Para March, NP      PDMP not reviewed this encounter.   Clancy Gourd, NP 03/24/23 1635

## 2023-03-24 NOTE — Discharge Instructions (Signed)
Rest,push fluids, take antibiotic,prednisone,cough med as prescribed.  May try using over the counter throat lozenges, mucinex or sudafed as label directed, hot tea, honey for cough.   Any antibiotic may cause upset stomach, resistance, photosensitivity

## 2023-07-05 ENCOUNTER — Ambulatory Visit (LOCAL_COMMUNITY_HEALTH_CENTER)

## 2023-07-05 DIAGNOSIS — Z111 Encounter for screening for respiratory tuberculosis: Secondary | ICD-10-CM

## 2023-07-08 ENCOUNTER — Ambulatory Visit (LOCAL_COMMUNITY_HEALTH_CENTER)

## 2023-07-08 DIAGNOSIS — Z111 Encounter for screening for respiratory tuberculosis: Secondary | ICD-10-CM

## 2023-07-08 LAB — TB SKIN TEST
Induration: 0 mm
TB Skin Test: NEGATIVE

## 2023-09-03 ENCOUNTER — Other Ambulatory Visit: Payer: Self-pay | Admitting: Family Medicine

## 2023-09-03 DIAGNOSIS — Z1231 Encounter for screening mammogram for malignant neoplasm of breast: Secondary | ICD-10-CM

## 2023-09-24 ENCOUNTER — Ambulatory Visit
Admission: RE | Admit: 2023-09-24 | Discharge: 2023-09-24 | Disposition: A | Discharge status: Home / Self Care | Source: Ambulatory Visit | Attending: Family Medicine | Admitting: Family Medicine

## 2023-09-24 DIAGNOSIS — Z1231 Encounter for screening mammogram for malignant neoplasm of breast: Secondary | ICD-10-CM | POA: Insufficient documentation
